# Patient Record
Sex: Male | Born: 1955 | Race: White | Hispanic: No | Marital: Married | Smoking: Never smoker
Health system: Southern US, Community
[De-identification: ages and names within clinical notes are randomized; demographics above are authoritative.]

## PROBLEM LIST (undated history)

## (undated) DIAGNOSIS — C3490 Malignant neoplasm of unspecified part of unspecified bronchus or lung: Secondary | ICD-10-CM

## (undated) DIAGNOSIS — E039 Hypothyroidism, unspecified: Secondary | ICD-10-CM

## (undated) DIAGNOSIS — D0439 Carcinoma in situ of skin of other parts of face: Secondary | ICD-10-CM

## (undated) DIAGNOSIS — I5020 Unspecified systolic (congestive) heart failure: Secondary | ICD-10-CM

## (undated) DIAGNOSIS — G4733 Obstructive sleep apnea (adult) (pediatric): Secondary | ICD-10-CM

## (undated) HISTORY — DX: Hypothyroidism, unspecified: E03.9

## (undated) HISTORY — DX: Obstructive sleep apnea (adult) (pediatric): G47.33

## (undated) HISTORY — DX: Carcinoma in situ of skin of other parts of face: D04.39

## (undated) HISTORY — DX: Malignant neoplasm of unspecified part of unspecified bronchus or lung: C34.90

## (undated) HISTORY — DX: Unspecified systolic (congestive) heart failure: I50.20

---

## 2003-07-02 ENCOUNTER — Ambulatory Visit (HOSPITAL_BASED_OUTPATIENT_CLINIC_OR_DEPARTMENT_OTHER): Admission: RE | Admit: 2003-07-02 | Discharge: 2003-07-02 | Payer: Self-pay | Admitting: Otolaryngology

## 2003-09-08 ENCOUNTER — Ambulatory Visit (HOSPITAL_COMMUNITY): Admission: RE | Admit: 2003-09-08 | Discharge: 2003-09-08 | Payer: Self-pay | Admitting: Otolaryngology

## 2017-12-17 ENCOUNTER — Encounter: Payer: Self-pay | Admitting: Cardiovascular Disease

## 2017-12-17 ENCOUNTER — Telehealth: Payer: Self-pay | Admitting: *Deleted

## 2017-12-17 ENCOUNTER — Ambulatory Visit: Payer: No Typology Code available for payment source | Admitting: Cardiovascular Disease

## 2017-12-17 VITALS — BP 134/82 | HR 111 | Ht 66.0 in | Wt 213.2 lb

## 2017-12-17 DIAGNOSIS — R600 Localized edema: Secondary | ICD-10-CM | POA: Diagnosis not present

## 2017-12-17 DIAGNOSIS — R9431 Abnormal electrocardiogram [ECG] [EKG]: Secondary | ICD-10-CM

## 2017-12-17 DIAGNOSIS — R0602 Shortness of breath: Secondary | ICD-10-CM

## 2017-12-17 DIAGNOSIS — R000 Tachycardia, unspecified: Secondary | ICD-10-CM | POA: Diagnosis not present

## 2017-12-17 DIAGNOSIS — R0600 Dyspnea, unspecified: Secondary | ICD-10-CM | POA: Diagnosis not present

## 2017-12-17 DIAGNOSIS — I452 Bifascicular block: Secondary | ICD-10-CM

## 2017-12-17 DIAGNOSIS — Z13220 Encounter for screening for lipoid disorders: Secondary | ICD-10-CM

## 2017-12-17 MED ORDER — CARVEDILOL 3.125 MG PO TABS: 3.1250 mg | ORAL_TABLET | Freq: Two times a day (BID) | ORAL | 3 refills | Status: DC

## 2017-12-17 MED ORDER — FUROSEMIDE 40 MG PO TABS: 40.0000 mg | ORAL_TABLET | Freq: Every day | ORAL | 3 refills | Status: DC

## 2017-12-17 NOTE — Patient Instructions (Addendum)
Medication Instructions:  START Lasix 40 mg daily START carvedilol (Coreg) 3.125 mg two times daily  If you need a refill on your cardiac medications before your next appointment, please call your pharmacy.   Lab work: Today (CMET, CBC, Lipid, TSH, Mag, BNP) If you have labs (blood work) drawn today and your tests are completely normal, you will receive your results only by: [name] MyChart Message (if you have MyChart) OR . A paper copy in the mail If you have any lab test that is abnormal or we need to change your treatment, we will call you to review the results.  Testing/Procedures: Your physician has requested that you have an echocardiogram THIS WEEK. Echocardiography is a painless test that uses sound waves to create images of your heart. It provides your doctor with information about the size and shape of your heart and how well your heart's chambers and valves are working. This procedure takes approximately one hour. There are no restrictions for this procedure.  Follow-Up: Monday 10/28 at 3:20 pm with Dr. Claiborne Billings   **patient called to make aware chest xray was added and to have this completed**

## 2017-12-17 NOTE — Telephone Encounter (Signed)
Spoke to patient. Patient walked int o the office stating he needs to see a cardiologist-  He has a EKG  WITH HIM -- C/O OF SHORTNESS OF BREATHE   BILATERAL LOWER  AREA FLUID RETENTION   OCCURRING FOR A FEW WEEKS. Patient has not establish with primary or  Cardiologist   RN  recommendation going to urgent care or ER. Patient wanted to know if anyone can see him today . RN informed patient  There is an availability today-  Appointment made. patient aware

## 2017-12-17 NOTE — Progress Notes (Signed)
Cardiology Office Note    Date:  12/17/2017   ID:  Melvin Marshall, Melvin Marshall 1955-11-05, MRN [id number]  PCP:  [facility]  Cardiologist:  Shelva Majestic, MD   Chief Complaint  Patient presents with  . Chest Pain    pressure   New cardiology evaluation.  He is seen as a walk-in for evaluation of progressive increasing shortness of breath leg swelling PND and orthopnea.  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  Mr. Hamada denies any awareness of prior cardiac disease.  In late August he began to notice some increasing shortness of breath and felt he was having some respiratory difficulties.  He went to urgent care, was given a Z-Pak and was started on empiric albuterol and received a Medrol Dosepak.  He did not have definitive benefit from this treatment.  He also admitted to postnasal drainage and has been taking some Claritin-D.  At the time he states his O2 saturation was 96% and his pulse was increased.  Over the past month he has noticed progressive shortness of breath particularly more with activity and with periods of agitation.  He also has noticed a vague chest pressure.  This past weekend while at work in the [employer] he was checked out apparently at the [employer].  He was not prescribed any therapy.  Because of increasing symptoms of shortness of breath, with development of PND orthopnea and at times feeling like he has to sit up at nighttime to breathe he walked into our office today and was worked into my schedule and seen as an add-on.  History is negative for prior surgeries. Current Medications: Outpatient Medications Prior to Visit  Medication Sig Dispense Refill  . albuterol (PROVENTIL HFA;VENTOLIN HFA) 108 (90 Base) MCG/ACT inhaler INHALE 1 TO 2 PUFFS EVERY 4 TO 6 HOURS AS NEEDED  0  . aspirin 81 MG chewable tablet  Chew 81 mg by mouth daily.    Melvin Marshall GARLIC PO Take by mouth.    . Omega-3 Fatty Acids (FISH OIL OMEGA-3 PO) Take by mouth daily.    . COD LIVER OIL PO Take by mouth daily.     No facility-administered medications prior to visit.      Allergies:   Patient has no known allergies.   Social History   Socioeconomic History  . Marital status: Married    Spouse name: Not on file  . Number of children: 4  . Years of education: Not on file  . Highest education level: Bachelor's degree (e.g., BA, AB, BS)  Occupational History  . Occupation: optometry    Comment: self employed  Social Needs  . Financial resource strain: Not on file  . Food insecurity:    Worry: Not on file    Inability: Not on file  . Transportation needs:    Medical: Not on file    Non-medical: Not on file  Tobacco Use  . Smoking status: Never Smoker  . Smokeless tobacco: Never Used  Substance and Sexual Activity  . Alcohol use: Not Currently  . Drug use: Never  . Sexual activity: Not on file  Lifestyle  . Physical activity:    Days per week: Not on file    Minutes per session: Not on file  . Stress: Not on file  Relationships  . Social connections:  Talks on phone: Not on file    Gets together: Not on file    Attends religious service: Not on file    Active member of club or organization: Not on file    Attends meetings of clubs or organizations: Not on file    Relationship status: Not on file  Other Topics Concern  . Not on file  Social History Narrative  . Not on file    Socially he was born in [place]. He did his undergraduate degree at [employer] and ultimately attended [employer] in [place].  He is married for 37 years.  There is no tobacco or alcohol use. He has 4 children.  Family History:  The patient's family history includes COPD in his father; Diabetes type I in his child; Heart failure in his father; Liver disease in his mother; Suicidality in his paternal grandfather.   His  mother died at age 35 and had liver disease from a transfusion, father died at 87 and had CHF and COPD.  He has 3 sisters ages 107, 67, and 59.  ROS General: Negative; No fevers, chills, or night sweats;  HEENT: Negative; No changes in vision or hearing, sinus congestion, difficulty swallowing Pulmonary: Negative; No cough, wheezing, shortness of breath, hemoptysis Cardiovascular: See HPI GI: Negative; No nausea, vomiting, diarrhea, or abdominal pain GU: Negative; No dysuria, hematuria, or difficulty voiding Musculoskeletal: Negative; no myalgias, joint pain, or weakness Hematologic/Oncology: Negative; no easy bruising, bleeding Endocrine: Negative; no heat/cold intolerance; no diabetes Neuro: Negative; no changes in balance, headaches Skin: Negative; No rashes or skin lesions Psychiatric: Negative; No behavioral problems, depression Sleep: Negative; No snoring, daytime sleepiness, hypersomnolence, bruxism, restless legs, hypnogognic hallucinations, no cataplexy Other comprehensive 14 point system review is negative.   PHYSICAL EXAM:   VS:  BP 134/82   Pulse (!) 111   Ht 5' 6"  (1.676 m)   Wt 213 lb 3.2 oz (96.7 kg)   BMI 34.41 kg/m     Repeat blood pressure by me 130/78  Wt Readings from Last 3 Encounters:  12/17/17 213 lb 3.2 oz (96.7 kg)    General: Alert, oriented, no distress.  Skin: normal turgor, no rashes, warm and dry HEENT: Normocephalic, atraumatic. Pupils equal round and reactive to light; sclera anicteric; extraocular muscles intact; Fundi no hemorrhages or exudates. Nose without nasal septal hypertrophy Mouth/Parynx benign; Mallinpatti scale 3 Neck: No JVD, no carotid bruits; normal carotid upstroke Lungs: Decreased breath sounds at the bases without audible wheezing Chest wall: without tenderness to palpitation Heart: PMI not displaced, tachycardic at 111, s1 s2 normal, 1/6 systolic murmur, no diastolic murmur, summation gallop, no thrills, or heaves Abdomen:  soft, nontender; no hepatosplenomehaly, BS+; abdominal aorta nontender and not dilated by palpation. Back: no CVA tenderness Pulses 2+ Musculoskeletal: full range of motion, normal strength, no joint deformities Extremities: 2-3+ pitting edema to below the knees no clubbing cyanosis, Homan's sign negative  Neurologic: grossly nonfocal; Cranial nerves grossly wnl Psychologic: Normal mood and affect   Studies/Labs Reviewed:   EKG:  EKG is ordered today.  ECG (independently read by me): Sinus tachycardia at 111 bpm.  PVC.  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  PR interval 158 ms.  QTc interval 505 ms.    I personally reviewed the ECG that he had done at the [facility] on December 15, 2017 which shows sinus rhythm at 98 with right bundle branch block and left anterior hemiblock; PVC.  Recent Labs: No flowsheet data found.   No flowsheet data found.  No flowsheet data found. No results found for: MCV No results found for: TSH No results found for: HGBA1C   BNP No results found for: BNP  ProBNP No results found for: PROBNP   Lipid Panel  No results found for: CHOL, TRIG, HDL, CHOLHDL, VLDL, LDLCALC, LDLDIRECT   RADIOLOGY: No results found.   Additional studies/ records that were reviewed today include:  No prior medical records available   ASSESSMENT:    1. Shortness of breath   2. Paroxysmal nocturnal dyspnea   3. Bilateral leg edema   4. Tachycardia   5. Right bundle branch block with left anterior fascicular block   6. Abnormal EKG   7. 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  He had developed some respiratory symptoms in late August and empirically was treated with a Z-Pak, albuterol, and Medrol Dosepak.  Over the past month he has had progressive symptoms of shortness of breath.  Upon further questioning it appears he is having some PND  and orthopnea.  He is unaware of recent audible wheezing.  He has not had a recent chest x-ray.  His examination is notable for at least 2-3+ pitting edema to below the knees.  He has not had any recent laboratory.  I do not know his renal function or laboratory status.  I am concerned that he is having symptoms highly suggestive of heart failure.  He is tachycardic and has a summation gallop.  He also has evidence for conduction abnormality with right bundle branch block and left anterior hemiblock.  His ECG at the [facility] did show some occasional PVCs.  I am giving him a prescription for furosemide 40 mg to initiate daily.  I will start him on carvedilol 3.125 mg twice a day.  I am recommending he undergo a PA and lateral chest x-ray.  I am checking laboratory today consisting of a C met, magnesium level, BNP, TSH, and lipid studies.  I will schedule him for an echo Doppler study to evaluate systolic and diastolic function.  I will work him into my office next week and see him in 1 week for reevaluation.  Medication Adjustments/Labs and Tests Ordered: Current medicines are reviewed at length with the patient today.  Concerns regarding medicines are outlined above.  Medication changes, Labs and Tests ordered today are listed in the Patient Instructions below. Patient Instructions  Medication Instructions:  START Lasix 40 mg daily START carvedilol (Coreg) 3.125 mg two times daily  If you need a refill on your cardiac medications before your next appointment, please call your pharmacy.   Lab work: Today (CMET, CBC, Lipid, TSH, Mag, BNP) If you have labs (blood work) drawn today and your tests are completely normal, you will receive your results only by: Melvin Marshall MyChart Message (if you have MyChart) OR . A paper copy in the mail If you have any lab test that is abnormal or we need to change your treatment, we will call you to review the results.  Testing/Procedures: Your physician has requested  that you have an echocardiogram THIS WEEK. Echocardiography is a painless test that uses sound waves to create images of your heart. It provides your doctor with information about the size and shape of your heart and how well your heart's chambers and valves are working. This procedure takes approximately one hour.  There are no restrictions for this procedure.  Follow-Up: Monday 10/28 at 3:20 pm with Dr. Claiborne Billings   **patient called to make aware chest xray was added and to have this completed**    Signed, Shelva Majestic, MD  12/17/2017 7:22 PM    [facility] [address] [place], [place], [place]  [address] Phone: [phone]

## 2017-12-18 LAB — COMPREHENSIVE METABOLIC PANEL
A/G RATIO: 1.7 (ref 1.2–2.2)
ALT: 22 IU/L (ref 0–44)
AST: 22 IU/L (ref 0–40)
Albumin: 4.1 g/dL (ref 3.6–4.8)
Alkaline Phosphatase: 53 IU/L (ref 39–117)
BUN/Creatinine Ratio: 28 — ABNORMAL HIGH (ref 10–24)
BUN: 30 mg/dL — ABNORMAL HIGH (ref 8–27)
Bilirubin Total: 0.7 mg/dL (ref 0.0–1.2)
CO2: 21 mmol/L (ref 20–29)
CREATININE: 1.07 mg/dL (ref 0.76–1.27)
Calcium: 9 mg/dL (ref 8.6–10.2)
Chloride: 104 mmol/L (ref 96–106)
GFR, EST AFRICAN AMERICAN: 86 mL/min/{1.73_m2} (ref >59)
GFR, EST NON AFRICAN AMERICAN: 75 mL/min/{1.73_m2} (ref >59)
GLOBULIN, TOTAL: 2.4 g/dL (ref 1.5–4.5)
GLUCOSE: 110 mg/dL — AB (ref 65–99)
POTASSIUM: 4.4 mmol/L (ref 3.5–5.2)
SODIUM: 142 mmol/L (ref 134–144)
TOTAL PROTEIN: 6.5 g/dL (ref 6.0–8.5)

## 2017-12-18 LAB — BRAIN NATRIURETIC PEPTIDE: BNP: 1653.5 pg/mL — ABNORMAL HIGH (ref 0.0–100.0)

## 2017-12-18 LAB — CBC
HEMOGLOBIN: 14.6 g/dL (ref 13.0–17.7)
Hematocrit: 45.9 % (ref 37.5–51.0)
MCH: 26.3 pg — AB (ref 26.6–33.0)
MCHC: 31.8 g/dL (ref 31.5–35.7)
MCV: 83 fL (ref 79–97)
PLATELETS: 172 10*3/uL (ref 150–450)
RBC: 5.55 x10E6/uL (ref 4.14–5.80)
RDW: 16.6 % — ABNORMAL HIGH (ref 12.3–15.4)
WBC: 4.3 10*3/uL (ref 3.4–10.8)

## 2017-12-18 LAB — LIPID PANEL
CHOLESTEROL TOTAL: 169 mg/dL (ref 100–199)
Chol/HDL Ratio: 5.5 ratio — ABNORMAL HIGH (ref 0.0–5.0)
HDL: 31 mg/dL — ABNORMAL LOW (ref >39)
LDL CALC: 116 mg/dL — AB (ref 0–99)
Triglycerides: 109 mg/dL (ref 0–149)
VLDL CHOLESTEROL CAL: 22 mg/dL (ref 5–40)

## 2017-12-18 LAB — MAGNESIUM: Magnesium: 2.1 mg/dL (ref 1.6–2.3)

## 2017-12-18 LAB — TSH: TSH: 4.7 u[IU]/mL — ABNORMAL HIGH (ref 0.450–4.500)

## 2017-12-20 ENCOUNTER — Ambulatory Visit
Admission: RE | Admit: 2017-12-20 | Discharge: 2017-12-20 | Disposition: A | Discharge status: Home / Self Care | Payer: No Typology Code available for payment source | Source: Ambulatory Visit | Attending: Cardiovascular Disease | Admitting: Cardiovascular Disease

## 2017-12-20 ENCOUNTER — Other Ambulatory Visit (HOSPITAL_COMMUNITY): Payer: No Typology Code available for payment source

## 2017-12-20 DIAGNOSIS — R0602 Shortness of breath: Secondary | ICD-10-CM

## 2017-12-20 DIAGNOSIS — R000 Tachycardia, unspecified: Secondary | ICD-10-CM

## 2017-12-21 ENCOUNTER — Other Ambulatory Visit: Payer: Self-pay

## 2017-12-21 ENCOUNTER — Ambulatory Visit (HOSPITAL_COMMUNITY): Payer: No Typology Code available for payment source | Attending: Cardiovascular Disease

## 2017-12-21 DIAGNOSIS — R9431 Abnormal electrocardiogram [ECG] [EKG]: Secondary | ICD-10-CM | POA: Diagnosis present

## 2017-12-21 DIAGNOSIS — R600 Localized edema: Secondary | ICD-10-CM | POA: Diagnosis not present

## 2017-12-21 DIAGNOSIS — R0602 Shortness of breath: Secondary | ICD-10-CM | POA: Insufficient documentation

## 2017-12-21 MED ORDER — PERFLUTREN LIPID MICROSPHERE
1.0000 mL | INTRAVENOUS | Status: AC | PRN
  Administered 2017-12-21: 2 mL via INTRAVENOUS

## 2017-12-24 ENCOUNTER — Other Ambulatory Visit (HOSPITAL_COMMUNITY): Payer: No Typology Code available for payment source

## 2017-12-24 ENCOUNTER — Ambulatory Visit: Payer: No Typology Code available for payment source | Admitting: Cardiovascular Disease

## 2017-12-24 ENCOUNTER — Encounter: Payer: Self-pay | Admitting: Cardiovascular Disease

## 2017-12-24 ENCOUNTER — Ambulatory Visit (INDEPENDENT_AMBULATORY_CARE_PROVIDER_SITE_OTHER): Payer: No Typology Code available for payment source | Admitting: Cardiovascular Disease

## 2017-12-24 VITALS — BP 104/80 | HR 94 | Ht 66.0 in | Wt 197.4 lb

## 2017-12-24 DIAGNOSIS — I452 Bifascicular block: Secondary | ICD-10-CM

## 2017-12-24 DIAGNOSIS — R600 Localized edema: Secondary | ICD-10-CM | POA: Diagnosis not present

## 2017-12-24 DIAGNOSIS — R0602 Shortness of breath: Secondary | ICD-10-CM

## 2017-12-24 DIAGNOSIS — I5041 Acute combined systolic (congestive) and diastolic (congestive) heart failure: Secondary | ICD-10-CM | POA: Diagnosis not present

## 2017-12-24 MED ORDER — SACUBITRIL-VALSARTAN 24-26 MG PO TABS: 1.0000 | ORAL_TABLET | Freq: Two times a day (BID) | ORAL | 0 refills | Status: DC

## 2017-12-24 NOTE — Patient Instructions (Addendum)
Medication Instructions:  Start Entresto 24/26 mg (1 tablet) two times daily  If you need a refill on your cardiac medications before your next appointment, please call your pharmacy.   Follow-Up: Thursday 11/7 at 11:30 am with pharmacist  Monday 11/25 at 4 pm with Dr. Claiborne Billings

## 2017-12-24 NOTE — Progress Notes (Signed)
Cardiology Office Note    Date:  12/24/2017   ID:  Melvin Marshall, Melvin Marshall 15-Dec-1955, MRN [id number]  PCP:  [facility]  Cardiologist:  Shelva Majestic, MD   No chief complaint on file.  F/U cardiology evaluation.  I saw him as a walk-in for evaluation of progressive increasing shortness of breath leg swelling PND and orthopnea on 12/17/2017.  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  Melvin Marshall denies any awareness of prior cardiac disease.  In late August he began to notice some increasing shortness of breath and felt he was having some respiratory difficulties.  He went to urgent care, was given a Z-Pak and was started on empiric albuterol and received a Medrol Dosepak.  He did not have definitive benefit from this treatment.  He also admitted to postnasal drainage and has been taking some Claritin-D.  At the time he states his O2 saturation was 96% and his pulse was increased.  Over the past month he has noticed progressive shortness of breath particularly more with activity and with periods of agitation.  He also has noticed a vague chest pressure.  This past weekend while at work in the [employer] he was checked out apparently at the [employer].  He was not prescribed any therapy.  Because of increasing symptoms of shortness of breath, with development of PND orthopnea and at times feeling like he has to sit up at nighttime to breathe he walked into our on December 17, 2017 office  and was worked into my schedule and seen as an add-on.  When I saw him, I was concerned that his symptom complex were highly suggestive of acute systolic heart failure.  At the time, I did not know any laboratory status and in particular was unaware of his renal function.  He was tachycardic with a pulse of 111 and had a summation gallop in addition to 2-3+  pitting edema to below the knees.  I initiated carvedilol at 3.125 mg twice a day and started furosemide 40 mg daily.  He underwent an chest x-ray which showed right basilar infiltrate with associated effusion.  Laboratory revealed an elevated BNP at 1653.  Creatinine was 1.07.  Hemoglobin 14.6, hematocrit 45.9.  Lipid studies revealed total cholesterol 169, HDL 31, LDL 116 and triglycerides 109.  TSH was 4.7.  I recommended he return to the office 1 week later for reassessment.  Over the past week, he states that he has lost 16 pounds of fluid.  His lower extremity edema is significantly better.  He is breathing better.  He denies chest pain or palpitations.  He presents for reevaluation.  History is negative for prior surgeries.  Current Medications: Outpatient Medications Prior to Visit  Medication Sig Dispense Refill  . albuterol (PROVENTIL HFA;VENTOLIN HFA) 108 (90 Base) MCG/ACT inhaler INHALE 1 TO 2 PUFFS EVERY 4 TO 6 HOURS AS NEEDED  0  . aspirin 81 MG chewable tablet Chew 81 mg by mouth daily.    . carvedilol (COREG) 3.125 MG tablet Take 1 tablet (3.125 mg total) by mouth 2 (two) times daily. 60 tablet 3  . COD LIVER OIL PO Take by mouth daily.    . furosemide (LASIX) 40 MG tablet Take 1 tablet (40 mg total) by mouth daily. 90 tablet 3  . GARLIC PO Take by mouth.    [name]  Loratadine-Pseudoephedrine (CLARITIN-D 24 HOUR PO) Take by mouth.    . Omega-3 Fatty Acids (FISH OIL OMEGA-3 PO) Take by mouth daily.     No facility-administered medications prior to visit.      Allergies:   Patient has no known allergies.   Social History   Socioeconomic History  . Marital status: Married    Spouse name: Not on file  . Number of children: 4  . Years of education: Not on file  . Highest education level: Bachelor's degree (e.g., BA, AB, BS)  Occupational History  . Occupation: optometry    Comment: self employed  Social Needs  . Financial resource strain: Not on file  . Food insecurity:     Worry: Not on file    Inability: Not on file  . Transportation needs:    Medical: Not on file    Non-medical: Not on file  Tobacco Use  . Smoking status: Never Smoker  . Smokeless tobacco: Never Used  Substance and Sexual Activity  . Alcohol use: Not Currently  . Drug use: Never  . Sexual activity: Not on file  Lifestyle  . Physical activity:    Days per week: Not on file    Minutes per session: Not on file  . Stress: Not on file  Relationships  . Social connections:    Talks on phone: Not on file    Gets together: Not on file    Attends religious service: Not on file    Active member of club or organization: Not on file    Attends meetings of clubs or organizations: Not on file    Relationship status: Not on file  Other Topics Concern  . Not on file  Social History Narrative  . Not on file    Socially he was born in [place]. He did his undergraduate degree at [employer] and ultimately attended [employer] in [place].  He is married for 37 years.  There is no tobacco or alcohol use. He has 4 children.  Family History:  The patient's family history includes COPD in his father; Diabetes type I in his child; Heart failure in his father; Liver disease in his mother; Suicidality in his paternal grandfather.   His mother died at age 46 and had liver disease from a transfusion, father died at 18 and had CHF and COPD.  He has 3 sisters ages 42, 41, and 32.  ROS General: Negative; No fevers, chills, or night sweats;  HEENT: Negative; No changes in vision or hearing, sinus congestion, difficulty swallowing Pulmonary: Negative; No cough, wheezing, shortness of breath, hemoptysis Cardiovascular: See HPI GI: Negative; No nausea, vomiting, diarrhea, or abdominal pain GU: Negative; No dysuria, hematuria, or difficulty voiding Musculoskeletal: Negative; no myalgias, joint pain, or weakness Hematologic/Oncology: Negative; no easy bruising, bleeding Endocrine: Negative; no  heat/cold intolerance; no diabetes Neuro: Negative; no changes in balance, headaches Skin: Negative; No rashes or skin lesions Psychiatric: Negative; No behavioral problems, depression Sleep: Negative; No snoring, daytime sleepiness, hypersomnolence, bruxism, restless legs, hypnogognic hallucinations, no cataplexy Other comprehensive 14 point system review is negative.   PHYSICAL EXAM:   VS:  BP 104/80   Pulse 94   Ht _0  (1.676 m)   Wt 197 lb 6.4 oz (89.5 kg)   BMI 31.86 kg/m     Repeat blood pressure by me was 120/80 supine and 124/80 standing  Wt Readings from Last 3 Encounters:  12/24/17 197 lb 6.4 oz (89.5 kg)  12/17/17 213  lb 3.2 oz (96.7 kg)      Physical Exam BP 104/80   Pulse 94   Ht '5\' 6"'$  (1.676 m)   Wt 197 lb 6.4 oz (89.5 kg)   BMI 31.86 kg/m  General: Alert, oriented, no distress.  Skin: normal turgor, no rashes, warm and dry HEENT: Normocephalic, atraumatic. Pupils equal round and reactive to light; sclera anicteric; extraocular muscles intact;  Nose without nasal septal hypertrophy Mouth/Parynx benign; Mallinpatti scale 3 Neck: No JVD, no carotid bruits; normal carotid upstroke Lungs: clear to ausculatation and percussion; no wheezing or rales Chest wall: without tenderness to palpitation Heart: PMI not displaced, RRR, s1 s2 normal, 1/6 systolic murmur, summation gallop no longer heard; no diastolic murmur, no rubs, gallops, thrills, or heaves Abdomen: soft, nontender; no hepatosplenomehaly, BS+; abdominal aorta nontender and not dilated by palpation. Back: no CVA tenderness Pulses 2+ Musculoskeletal: full range of motion, normal strength, no joint deformities Extremities:  1+ edema, significantly improved from 3+ pitting;no clubbing cyanosis or edema, Homan's sign negative  Neurologic: grossly nonfocal; Cranial nerves grossly wnl Psychologic: Normal mood and affect   Studies/Labs Reviewed:   EKG:  EKG is ordered today.  ECG (independently read by  me): NSR at 94; RBBB with repolarization changes; QTc 512 msec  December 17, 2017 ECG (independently read by me): Sinus tachycardia at 111 bpm.  PVC.  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  PR interval 158 ms.  QTc interval 505 ms.    I personally reviewed the ECG that he had done at the [facility] on December 15, 2017 which shows sinus rhythm at 98 with right bundle branch block and left anterior hemiblock; PVC.   Recent Labs: BMP Latest Ref Rng & Units 12/17/2017  Glucose 65 - 99 mg/dL 110(H)  BUN 8 - 27 mg/dL 30(H)  Creatinine 0.76 - 1.27 mg/dL 1.07  BUN/Creat Ratio 10 - 24 28(H)  Sodium 134 - 144 mmol/L 142  Potassium 3.5 - 5.2 mmol/L 4.4  Chloride 96 - 106 mmol/L 104  CO2 20 - 29 mmol/L 21  Calcium 8.6 - 10.2 mg/dL 9.0     Hepatic Function Latest Ref Rng & Units 12/17/2017  Total Protein 6.0 - 8.5 g/dL 6.5  Albumin 3.6 - 4.8 g/dL 4.1  AST 0 - 40 IU/L 22  ALT 0 - 44 IU/L 22  Alk Phosphatase 39 - 117 IU/L 53  Total Bilirubin 0.0 - 1.2 mg/dL 0.7    CBC Latest Ref Rng & Units 12/17/2017  WBC 3.4 - 10.8 x10E3/uL 4.3  Hemoglobin 13.0 - 17.7 g/dL 14.6  Hematocrit 37.5 - 51.0 % 45.9  Platelets 150 - 450 x10E3/uL 172   Lab Results  Component Value Date   MCV 83 12/17/2017   Lab Results  Component Value Date   TSH 4.700 (H) 12/17/2017   No results found for: HGBA1C   BNP    Component Value Date/Time   BNP 1,653.5 (H) 12/17/2017 1026    ProBNP No results found for: PROBNP   Lipid Panel     Component Value Date/Time   CHOL 169 12/17/2017 1026   TRIG 109 12/17/2017 1026   HDL 31 (L) 12/17/2017 1026   CHOLHDL 5.5 (H) 12/17/2017 1026   LDLCALC 116 (H) 12/17/2017 1026     RADIOLOGY: Dg Chest 2 View  Result Date: 12/20/2017 CLINICAL DATA:  Shortness of breath for 2 weeks EXAM: CHEST - 2 VIEW COMPARISON:  None. FINDINGS: Cardiac shadow is enlarged. Right  basilar infiltrate is noted with associated small effusion.  Left lung is clear. No acute bony abnormality is noted. IMPRESSION: Right basilar infiltrate with associated effusion. Followup PA and lateral chest X-ray is recommended in 3-4 weeks following trial of antibiotic therapy to ensure resolution and exclude underlying malignancy. Electronically Signed   By: Inez Catalina M.D.   On: 12/20/2017 12:52     Additional studies/ records that were reviewed today include:  Reviewed his laboratory, chest x-ray, and echo Doppler study.  ------------------------------------------------------------------- 12/21/2017 ECHO Study Conclusions  - Left ventricle: The cavity size was severely dilated. Wall   thickness was increased in a pattern of mild LVH. Systolic   function was severely reduced. The estimated ejection fraction   was in the range of 20% to 25%. Irregular apical contour, no   definite thrombus with Definity contrast. Severe global   hypokinesis with regional variation. The study is not technically   sufficient to allow evaluation of LV diastolic function. - Aortic valve: Trileaflet; mildly thickened leaflets. There was no   regurgitation. - Mitral valve: Mildly thickened leaflets . There was moderate   regurgitation. - Left atrium: Severely dilated. - Right ventricle: The cavity size was mildly dilated. Mild   systolic dysfunction. - Right atrium: The atrium was mildly dilated. - Tricuspid valve: There was moderate regurgitation. - Pulmonary arteries: PA peak pressure: 56 mm Hg (S). - Inferior vena cava: The vessel was dilated. The respirophasic   diameter changes were blunted (< 50%), consistent with elevated   central venous pressure. - Pericardium, extracardiac: Small pericardial effusion (mostly   posterior).  Impressions:  - LVEF 20-25%, severely dilated LV with mild LVH, severe global   hypokinesis with regional variation, moderate MR, severe LAE,   mild RVE with mild RV systolic dysfunction, mild RAE, moderate   TR, RVSP 56  mmHg, dilated IVC, small mostly posterior pericardial   effusion.  ASSESSMENT:    No diagnosis found.   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  He had developed  respiratory symptoms in late August and empirically was treated with a Z-Pak, albuterol, and Medrol Dosepak.  Over the past month he has had progressive symptoms of shortness of breath.  When I saw him, he was having PND, orthopnea, had 2-3+ pitting edema, and a summation gallop on exam.  Subsequent evaluation has confirmed my suspicion for CHF.  His echo Doppler study reveals an EF of 20 to 25% with significant LV dilation at end diastole at 68 mm in end systole at 58 mm.  There was moderate MR.  He had severe left atrial dilatation, mild right atrial dilatation, moderate TR, and moderate pulmonary hypertension with a peak PA pressure of 56.  He has lost 16 pounds over the past week which most likely is predominant fluid.  I reviewed his data with both he and his wife today.  I am initiating Entresto 24/26 mg twice daily for initial therapy.  I also again have recommended support stockings.  I have arranged for him to see our pharmacist next week and if he is doing well on the Entresto 24/26 mg dose I have recommended that Spironolactone be initiated at that time.  Repeat laboratory will be obtained.  We will also further attempt to increase carvedilol as blood pressure and pulse allow.  If tachycardia continues he may be a candidate for Corlanor.  I will see him in 4 weeks for reevaluation.  Over the next several months for we will try to optimize dose titration to maximum dosing and he ultimately will require an ischemic evaluation make certain his LV dysfunction is not ischemia mediated.    Medication Adjustments/Labs and Tests Ordered: Current medicines are reviewed at length with the patient today.  Concerns regarding medicines are outlined above.  Medication changes, Labs  and Tests ordered today are listed in the Patient Instructions below. There are no Patient Instructions on file for this visit.   Signed, Shelva Majestic, MD  12/24/2017 4:20 PM    [facility] [address] [place], [place], [place]  [address] Phone: [phone]

## 2017-12-26 ENCOUNTER — Encounter: Payer: Self-pay | Admitting: Cardiovascular Disease

## 2017-12-28 ENCOUNTER — Ambulatory Visit: Payer: No Typology Code available for payment source | Admitting: Cardiovascular Disease

## 2018-01-01 ENCOUNTER — Ambulatory Visit: Payer: No Typology Code available for payment source

## 2018-01-03 ENCOUNTER — Encounter: Payer: Self-pay | Admitting: Pharmacist Clinician (PhC)/ Clinical Pharmacy Specialist

## 2018-01-03 ENCOUNTER — Ambulatory Visit (INDEPENDENT_AMBULATORY_CARE_PROVIDER_SITE_OTHER)
Payer: No Typology Code available for payment source | Admitting: Pharmacist Clinician (PhC)/ Clinical Pharmacy Specialist

## 2018-01-03 DIAGNOSIS — I5041 Acute combined systolic (congestive) and diastolic (congestive) heart failure: Secondary | ICD-10-CM | POA: Insufficient documentation

## 2018-01-03 NOTE — Assessment & Plan Note (Signed)
Patient with heart failure, doing much better today.  His weight has dropped another 11 pounds in the past 10 days, so I am going to have him cut the furosemide from 40 mg to 20 mg daily.   His blood pressure was low (but not symptomatic) at 96/62.  Because of this will not be able to start spironolactone or increase either the Entresto or carvedilol.  He is due to see Dr. Claiborne Billings in 3 weeks for follow up.

## 2018-01-03 NOTE — Patient Instructions (Addendum)
Return for a a follow up appointment with Dr. Claiborne Billings in 2-3 weeks  Your blood pressure today is 96/62  Check your blood pressure at home daily (if able) and keep record of the readings.  Take your BP meds as follows:  Continue with all current medications  Cut lasx to 20  Bring all of your meds, your BP cuff and your record of home blood pressures to your next appointment.  Exercise as you're able, try to walk approximately 30 minutes per day.  Keep salt intake to a minimum, especially watch canned and prepared boxed foods.  Eat more fresh fruits and vegetables and fewer canned items.  Avoid eating in fast food restaurants.    HOW TO TAKE YOUR BLOOD PRESSURE: . Rest 5 minutes before taking your blood pressure. .  Don't smoke or drink caffeinated beverages for at least 30 minutes before. . Take your blood pressure before (not after) you eat. . Sit comfortably with your back supported and both feet on the floor (don't cross your legs). . Elevate your arm to heart level on a table or a desk. . Use the proper sized cuff. It should fit smoothly and snugly around your bare upper arm. There should be enough room to slip a fingertip under the cuff. The bottom edge of the cuff should be 1 inch above the crease of the elbow. . Ideally, take 3 measurements at one sitting and record the average.

## 2018-01-03 NOTE — Progress Notes (Signed)
01/03/2018 Melvin Marshall [birth year] [id number]   HPI:  Melvin Marshall is a 62 y.o. male patient of Dr Claiborne Billings, with a PMH below who presents today for heart failure medication titration.  He was seen initially in our office on Oct 21 with complaints of shortness of breath.  He had not seen cardiology prior to that time.  In August he began noticing the SOB.  Went to UC and was given zpak, medrol dosepak and albuterol MDI.  He did not have any significant benefit and continued on until mid-October.  He spent a weekend working in the [employer] and again had increased symptoms, along with PND.  Dr. Claiborne Billings started him on furosemide 40 mg daily and carvedilol 3.1215 mg bid, did full blood work and ordered an echocardiogram.   He saw Dr. Claiborne Billings one week later and was found to be down 16 pounds of fluid, with better breathing and less edema.  The Echo showed EF of 20-25% with a severely dilated LV.   Entresto 24/26 mg was started and he is here today with CVRR for follow up.  Dr. Claiborne Billings is hopeful that we can initiate spironolactone today.   Today patient reports he is feeling well.  No chest pain or shortness of breath, and no lower extremity edema.  He does have some concern about fatigue/apathy since starting the beta blocker.    Blood Pressure Goal:  130/80  Current Medications:  Furosemide 40 mg  Carvedilol 3.125 mg bid  Entresto 24/26 mg bid  Family Hx:  Father had CABG in late 23's, died at 35  Mother died at 3 from non-alcoholic liver disease  3 sisters, no specific cardiac issues  1 daughter with DM1  Social Hx:  No regular alcohol, no tobacco; drinking decaf recently, rarely more than 1-2 per day  Diet:  Has monitored sodium very closely since dx. Previously admits he probably ate much more than RDA, but now he figures about 1000-1300 mg per day.     Exercise:  Not much in past year.  Previously walked driveway 0.2 mi each way, would like to get back to it, but unsure because  of heart problems.  Home BP readings: none  Intolerances:  nkda  Labs: 11/2017:  Na 142, K 4.4, Glu 110, BUN 30, SCr 1.07, GFR 75; BNP 1653.5      Wt Readings from Last 3 Encounters:  01/03/18 186 lb 6.4 oz (84.6 kg)  12/24/17 197 lb 6.4 oz (89.5 kg)  12/17/17 213 lb 3.2 oz (96.7 kg)   BP Readings from Last 3 Encounters:  01/03/18 96/62  12/24/17 104/80  12/17/17 134/82   Pulse Readings from Last 3 Encounters:  01/03/18 80  12/24/17 94  12/17/17 (!) 111    Current Outpatient Medications  Medication Sig Dispense Refill  . aspirin 81 MG chewable tablet Chew 81 mg by mouth daily.    . carvedilol (COREG) 3.125 MG tablet Take 1 tablet (3.125 mg total) by mouth 2 (two) times daily. 60 tablet 3  . COD LIVER OIL PO Take by mouth daily.    . furosemide (LASIX) 40 MG tablet Take 1 tablet (40 mg total) by mouth daily. 90 tablet 3  . GARLIC PO Take by mouth.    . Omega-3 Fatty Acids (FISH OIL OMEGA-3 PO) Take by mouth daily.    . sacubitril-valsartan (ENTRESTO) 24-26 MG Take 1 tablet by mouth 2 (two) times daily. 28 tablet 0   No current facility-administered  medications for this visit.     No Known Allergies  History reviewed. No pertinent past medical history.  Blood pressure 96/62, pulse 80, weight 186 lb 6.4 oz (84.6 kg).  Acute combined systolic and diastolic heart failure [facility]) Patient with heart failure, doing much better today.  His weight has dropped another 11 pounds in the past 10 days, so I am going to have him cut the furosemide from 40 mg to 20 mg daily.   His blood pressure was low (but not symptomatic) at 96/62.  Because of this will not be able to start spironolactone or increase either the Entresto or carvedilol.  He is due to see Dr. Claiborne Billings in 3 weeks for follow up.     Tommy Medal PharmD CPP [facility] [address] [place], [place] [address] [phone]

## 2018-01-08 ENCOUNTER — Telehealth: Payer: Self-pay | Admitting: Cardiovascular Disease

## 2018-01-08 ENCOUNTER — Other Ambulatory Visit: Payer: Self-pay | Admitting: Cardiovascular Disease

## 2018-01-08 ENCOUNTER — Other Ambulatory Visit: Payer: Self-pay | Admitting: *Deleted

## 2018-01-08 MED ORDER — SACUBITRIL-VALSARTAN 24-26 MG PO TABS: 1.0000 | ORAL_TABLET | Freq: Two times a day (BID) | ORAL | 1 refills | Status: DC

## 2018-01-08 NOTE — Telephone Encounter (Signed)
Informed pt samples are available for pick up at the front desk.  Entresto 24/26mg  Qty; 1 bottle Lot# [id number]

## 2018-01-08 NOTE — Telephone Encounter (Signed)
New Message   Patient calling the office for samples of medication:   1.  What medication and dosage are you requesting samples for? sacubitril-valsartan (ENTRESTO) 24-26 MG  2.  Are you currently out of this medication? yes

## 2018-01-08 NOTE — Telephone Encounter (Signed)
New Message   Pt states he is still waiting on this prescription   *STAT* If patient is at the pharmacy, call can be transferred to refill team.   1. Which medications need to be refilled? (please list name of each medication and dose if known) sacubitril-valsartan (ENTRESTO) 24-26 MG  2. Which pharmacy/location (including street and city if local pharmacy) is medication to be sent to? [id number] - [place], [place] - [address]  3. Do they need a 30 day or 90 day supply? [place]

## 2018-01-10 NOTE — Telephone Encounter (Signed)
Patient called again for his refill.

## 2018-01-10 NOTE — Telephone Encounter (Signed)
Rx sent 01-08-18 to [id number] - [place], [place] - [address]

## 2018-01-14 ENCOUNTER — Other Ambulatory Visit: Payer: Self-pay

## 2018-01-14 MED ORDER — SACUBITRIL-VALSARTAN 24-26 MG PO TABS: 1.0000 | ORAL_TABLET | Freq: Two times a day (BID) | ORAL | 5 refills | Status: DC

## 2018-01-14 NOTE — Telephone Encounter (Signed)
Rx(s) sent to pharmacy electronically.  

## 2018-01-21 ENCOUNTER — Ambulatory Visit (INDEPENDENT_AMBULATORY_CARE_PROVIDER_SITE_OTHER): Payer: No Typology Code available for payment source | Admitting: Cardiovascular Disease

## 2018-01-21 ENCOUNTER — Encounter: Payer: Self-pay | Admitting: Cardiovascular Disease

## 2018-01-21 VITALS — BP 118/70 | HR 85 | Ht 66.0 in | Wt 183.8 lb

## 2018-01-21 DIAGNOSIS — I452 Bifascicular block: Secondary | ICD-10-CM

## 2018-01-21 DIAGNOSIS — R600 Localized edema: Secondary | ICD-10-CM

## 2018-01-21 DIAGNOSIS — R0600 Dyspnea, unspecified: Secondary | ICD-10-CM | POA: Diagnosis not present

## 2018-01-21 DIAGNOSIS — I5041 Acute combined systolic (congestive) and diastolic (congestive) heart failure: Secondary | ICD-10-CM | POA: Diagnosis not present

## 2018-01-21 MED ORDER — SACUBITRIL-VALSARTAN 49-51 MG PO TABS: 1.0000 | ORAL_TABLET | Freq: Two times a day (BID) | ORAL | 3 refills | Status: DC

## 2018-01-21 MED ORDER — FUROSEMIDE 20 MG PO TABS: 20.0000 mg | ORAL_TABLET | Freq: Every day | ORAL | 3 refills | Status: DC

## 2018-01-21 NOTE — Patient Instructions (Signed)
Medication Instructions:  INCREASE Entresto to 49/51 mg two times daily DECREASE furosemide (Lasix) to 20 mg daily  Testing/Procedures: Your physician has recommended that you have a sleep study. This test records several body functions during sleep, including: brain activity, eye movement, oxygen and carbon dioxide blood levels, heart rate and rhythm, breathing rate and rhythm, the flow of air through your mouth and nose, snoring, body muscle movements, and chest and belly movement.  Follow-Up: At [facility], you and your health needs are our priority.  As part of our continuing mission to provide you with exceptional heart care, we have created designated Provider Care Teams.  These Care Teams include your primary Cardiologist (physician) and Advanced Practice Providers (APPs -  Physician Assistants and Nurse Practitioners) who all work together to provide you with the care you need, when you need it. You will need a follow up appointment in 6-8 weeks.  Please call our office 2 months in advance to schedule this appointment.  You may see Dr. Claiborne Billings or one of the following Advanced Practice Providers on your designated Care Team: [place], [place] . Fabian Sharp, PA-C

## 2018-01-21 NOTE — Progress Notes (Signed)
Cardiology Office Note    Date:  01/22/2018   ID:  Melvin Marshall 02/17/1956, MRN [id number]  PCP:  [facility]  Cardiologist:  Melvin Majestic, MD   Chief Complaint  Patient presents with  . Follow-up   F/U cardiology evaluation.    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  I saw him for follow-up 1 week later.  He presents for one-month follow-up evaluation.  Melvin Marshall denies any awareness of prior cardiac disease.  In late August he began to notice some increasing shortness of breath and felt he was having some respiratory difficulties.  He went to urgent care, was given a Z-Pak and was started on empiric albuterol and received a Medrol Dosepak.  He did not have definitive benefit from this treatment.  He also admitted to postnasal drainage and has been taking some Claritin-D.  At the time he states his O2 saturation was 96% and his pulse was increased.  Over the past month he has noticed progressive shortness of breath particularly more with activity and with periods of agitation.  He also has noticed a vague chest pressure.  This past weekend while at work in the [employer] he was checked out apparently at the [employer].  He was not prescribed any therapy.  Because of increasing symptoms of shortness of breath, with development of PND orthopnea and at times feeling like he has to sit up at nighttime to breathe he walked into our on December 17, 2017 office  and was worked into my schedule and seen as an add-on.  When I initially saw him, I was concerned that his symptom complex was highly suggestive of acute systolic heart failure.  At the time, I did not know any laboratory status and in particular was unaware of his renal function.  He was tachycardic with a pulse of 111 and had a summation gallop in addition to 2-3+  pitting edema to below the knees.  I initiated carvedilol at 3.125 mg twice a day and started furosemide 40 mg daily.  He underwent an chest x-ray which showed right basilar infiltrate with associated effusion.  Laboratory revealed an elevated BNP at 1653.  Creatinine was 1.07.  Hemoglobin 14.6, hematocrit 45.9.  Lipid studies revealed total cholesterol 169, HDL 31, LDL 116 and triglycerides 109.  TSH was 4.7.  I recommended he return to the office 1 week later for reassessment.  When I saw him on December 24, 2017 he was feeling significantly improved and had lost 16 pounds of fluid.  His lower extremity edema was significantly better.  He was breathing better and deniedchest pain or palpitations.  During that evaluation I initiated Entresto 24/26 mg twice a day and recommended support stockings.  I had recommended that he see our pharmacist 1 week later and if he was doing well on Entresto that Spironolactone 12.5 mg may be able to be started at that time.  He was seen by Melvin Marshall subsequent to my office visit and during her evaluation his blood pressure was low at 96/62.  He was asymptomatic.  He was advised to reduce Lasix from 40 mg down to 20 mg and deferred initiation of spironolactone at that time.  Over the past several weeks, he states he continued to take the 40 mg Lasix dose since he still had fluid and  he has lost additional fluid.  In total he has lost 30 pounds since his initial evaluation from 213 down to 183 pounds.  He denies any dizziness or lightheadedness.  He denies chest pressure.  His leg edema has significantly improved although mild edema persists.  He presents for reevaluation.   Current Medications: Outpatient Medications Prior to Visit  Medication Sig Dispense Refill  . aspirin 81 MG chewable tablet Chew 81 mg by mouth daily.    . carvedilol (COREG) 3.125 MG tablet Take 1 tablet (3.125 mg total) by mouth 2 (two) times daily. 60 tablet 3  . COD LIVER OIL PO Take by mouth  daily.    [name] GARLIC PO Take by mouth.    . Omega-3 Fatty Acids (FISH OIL OMEGA-3 PO) Take by mouth daily.    . furosemide (LASIX) 40 MG tablet Take 1 tablet (40 mg total) by mouth daily. 90 tablet 3  . sacubitril-valsartan (ENTRESTO) 24-26 MG Take 1 tablet by mouth 2 (two) times daily. 60 tablet 5   No facility-administered medications prior to visit.      Allergies:   Patient has no known allergies.   Social History   Socioeconomic History  . Marital status: Married    Spouse name: Not on file  . Number of children: 4  . Years of education: Not on file  . Highest education level: Bachelor's degree (e.g., BA, AB, BS)  Occupational History  . Occupation: optometry    Comment: self employed  Social Needs  . Financial resource strain: Not on file  . Food insecurity:    Worry: Not on file    Inability: Not on file  . Transportation needs:    Medical: Not on file    Non-medical: Not on file  Tobacco Use  . Smoking status: Never Smoker  . Smokeless tobacco: Never Used  Substance and Sexual Activity  . Alcohol use: Not Currently  . Drug use: Never  . Sexual activity: Not on file  Lifestyle  . Physical activity:    Days per week: Not on file    Minutes per session: Not on file  . Stress: Not on file  Relationships  . Social connections:    Talks on phone: Not on file    Gets together: Not on file    Attends religious service: Not on file    Active member of club or organization: Not on file    Attends meetings of clubs or organizations: Not on file    Relationship status: Not on file  Other Topics Concern  . Not on file  Social History Narrative  . Not on file    Socially he was born in [place]. He did his undergraduate degree at [employer] and ultimately attended [employer] in [place].  He is married for 37 years.  There is no tobacco or alcohol use. He has 4 children.  Family History:  The patient's family history includes COPD in his father; Diabetes  type I in his child; Heart failure in his father; Liver disease in his mother; Suicidality in his paternal grandfather.   His mother died at age 85 and had liver disease from a transfusion, father died at 27 and had CHF and COPD.  He has 3 sisters ages 106, 102, and 38.  ROS General: Negative; No fevers, chills, or night sweats;  HEENT: Negative; No changes in vision or hearing, sinus congestion, difficulty swallowing Pulmonary: Negative; No cough, wheezing, shortness of breath, hemoptysis Cardiovascular:  See HPI GI: Negative; No nausea, vomiting, diarrhea, or abdominal pain GU: Negative; No dysuria, hematuria, or difficulty voiding Musculoskeletal: Negative; no myalgias, joint pain, or weakness Hematologic/Oncology: Negative; no easy bruising, bleeding Endocrine: Negative; no heat/cold intolerance; no diabetes Neuro: Negative; no changes in balance, headaches Skin: Negative; No rashes or skin lesions Psychiatric: Negative; No behavioral problems, depression Sleep: Negative; No snoring, daytime sleepiness, hypersomnolence, bruxism, restless legs, hypnogognic hallucinations, no cataplexy Other comprehensive 14 point system review is negative.   PHYSICAL EXAM:   VS:  BP 118/70   Pulse 85   Ht '5\' 6"'$  (1.676 m)   Wt 183 lb 12.8 oz (83.4 kg)   BMI 29.67 kg/m     Repeat blood pressure by me was 122/70 without orthostatic change  Wt Readings from Last 3 Encounters:  01/21/18 183 lb 12.8 oz (83.4 kg)  01/03/18 186 lb 6.4 oz (84.6 kg)  12/24/17 197 lb 6.4 oz (89.5 kg)    General: Alert, oriented, no distress.  Skin: normal turgor, no rashes, warm and dry HEENT: Normocephalic, atraumatic. Pupils equal round and reactive to light; sclera anicteric; extraocular muscles intact;  Nose without nasal septal hypertrophy Mouth/Parynx benign; Mallinpatti scale 3 Neck: No JVD, no carotid bruits; normal carotid upstroke Lungs: clear to ausculatation and percussion; no wheezing or rales Chest wall:  without tenderness to palpitation Heart: PMI not displaced, RRR, s1 s2 normal, 1/6 systolic murmur, no diastolic murmur, no rubs, gallops, thrills, or heaves Abdomen: soft, nontender; no hepatosplenomehaly, BS+; abdominal aorta nontender and not dilated by palpation. Back: no CVA tenderness Pulses 2+ Musculoskeletal: full range of motion, normal strength, no joint deformities Extremities: Trace -1+ pitting edema bilaterally;no clubbing cyanosis or edema, Homan's sign negative  Neurologic: grossly nonfocal; Cranial nerves grossly wnl Psychologic: Normal mood and affect   Studies/Labs Reviewed:   EKG:  EKG is ordered today.  ECG (independently read by me): Normal sinus rhythm at 85 bpm.  Right bundle branch block with repolarization changes.  Left anterior hemiblock.  No ectopy.  December 24, 2017 ECG (independently read by me): NSR at 94; RBBB with repolarization changes; QTc 512 msec  December 17, 2017 ECG (independently read by me): Sinus tachycardia at 111 bpm.  PVC.  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  PR interval 158 ms.  QTc interval 505 ms.    I personally reviewed the ECG that he had done at [facility] on December 15, 2017 which shows sinus rhythm at 98 with right bundle branch block and left anterior hemiblock; PVC.   Recent Labs: BMP Latest Ref Rng & Units 12/17/2017  Glucose 65 - 99 mg/dL 110(H)  BUN 8 - 27 mg/dL 30(H)  Creatinine 0.76 - 1.27 mg/dL 1.07  BUN/Creat Ratio 10 - 24 28(H)  Sodium 134 - 144 mmol/L 142  Potassium 3.5 - 5.2 mmol/L 4.4  Chloride 96 - 106 mmol/L 104  CO2 20 - 29 mmol/L 21  Calcium 8.6 - 10.2 mg/dL 9.0     Hepatic Function Latest Ref Rng & Units 12/17/2017  Total Protein 6.0 - 8.5 g/dL 6.5  Albumin 3.6 - 4.8 g/dL 4.1  AST 0 - 40 IU/L 22  ALT 0 - 44 IU/L 22  Alk Phosphatase 39 - 117 IU/L 53  Total Bilirubin 0.0 - 1.2 mg/dL 0.7    CBC Latest Ref Rng & Units 12/17/2017  WBC 3.4 - 10.8 x10E3/uL  4.3  Hemoglobin 13.0 - 17.7 g/dL 14.6  Hematocrit 37.5 - 51.0 %  45.9  Platelets 150 - 450 x10E3/uL 172   Lab Results  Component Value Date   MCV 83 12/17/2017   Lab Results  Component Value Date   TSH 4.700 (H) 12/17/2017   No results found for: HGBA1C   BNP    Component Value Date/Time   BNP 1,653.5 (H) 12/17/2017 1026    ProBNP No results found for: PROBNP   Lipid Panel     Component Value Date/Time   CHOL 169 12/17/2017 1026   TRIG 109 12/17/2017 1026   HDL 31 (L) 12/17/2017 1026   CHOLHDL 5.5 (H) 12/17/2017 1026   LDLCALC 116 (H) 12/17/2017 1026     RADIOLOGY: No results found.   Additional studies/ records that were reviewed today include:  Reviewed his laboratory, chest x-ray, and echo Doppler study.  ------------------------------------------------------------------- 12/21/2017 ECHO Study Conclusions  - Left ventricle: The cavity size was severely dilated. Wall   thickness was increased in a pattern of mild LVH. Systolic   function was severely reduced. The estimated ejection fraction   was in the range of 20% to 25%. Irregular apical contour, no   definite thrombus with Definity contrast. Severe global   hypokinesis with regional variation. The study is not technically   sufficient to allow evaluation of LV diastolic function. - Aortic valve: Trileaflet; mildly thickened leaflets. There was no   regurgitation. - Mitral valve: Mildly thickened leaflets . There was moderate   regurgitation. - Left atrium: Severely dilated. - Right ventricle: The cavity size was mildly dilated. Mild   systolic dysfunction. - Right atrium: The atrium was mildly dilated. - Tricuspid valve: There was moderate regurgitation. - Pulmonary arteries: PA peak pressure: 56 mm Hg (S). - Inferior vena cava: The vessel was dilated. The respirophasic   diameter changes were blunted (< 50%), consistent with elevated   central venous pressure. - Pericardium, extracardiac:  Small pericardial effusion (mostly   posterior).  Impressions:  - LVEF 20-25%, severely dilated LV with mild LVH, severe global   hypokinesis with regional variation, moderate MR, severe LAE,   mild RVE with mild RV systolic dysfunction, mild RAE, moderate   TR, RVSP 56 mmHg, dilated IVC, small mostly posterior pericardial   effusion.  ASSESSMENT:    1. Acute combined systolic and diastolic heart failure (HCC)   2. Paroxysmal nocturnal dyspnea   3. Bilateral leg edema   4. 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  He had developed  respiratory symptoms in late August and empirically was treated with a Z-Pak, albuterol, and Medrol Dosepak.  Subsequently, he developed progressive symptoms of shortness of breath.  When I initially saw him, he was having PND, orthopnea, had 2-3+ pitting edema, and a summation gallop on exam.  Subsequent evaluation has confirmed my suspicion for CHF.  His echo Doppler study reveals an EF of 20 to 25% with significant LV dilation at end diastole at 68 mm in end systole at 58 mm.  There was moderate MR.  He had severe left atrial dilatation, mild right atrial dilatation, moderate TR, and moderate pulmonary hypertension with a peak PA pressure of 56.  I recommended Lasix 40 mg and started him on low-dose carvedilol.  He  lost 16 pounds over the initial week of therapy.  When I saw him one week later I initiated Entresto 24/26 twice a day and recommended again support  stockings.  He continues to diurese well with his therapy and when seen 1 week later by Melvin Marshall had lost another 11 pounds.  Since initiation of therapy he has now lost 30 pounds of fluid.  He is breathing well.  He denies PND orthopnea.  He has continued to take the furosemide 40 mg despite being told to reduce it to 20 mg and is only taken 20 mg on a few days.  His blood  pressure today is stable.  I have recommended he definitely drop his Lasix dose to 20 mg.  I will further titrate Entresto to 49/51 mg twice a day.  His resting pulse today is 85.  Right bundle branch block with left anterior hemiblock is stable.  I will see him in approximately 6 to 8 weeks.  If he continues to do well further titration of carvedilol with possible addition of spironolactone will be implemented.  We discussed potential titration up to Entresto 97/103 mg twice a day for optimal benefit.  Once he is stable, we also discussed an ischemic evaluation and subsequent echocardiographic assessment.     Medication Adjustments/Labs and Tests Ordered: Current medicines are reviewed at length with the patient today.  Concerns regarding medicines are outlined above.  Medication changes, Labs and Tests ordered today are listed in the Patient Instructions below. Patient Instructions  Medication Instructions:  INCREASE Entresto to 49/51 mg two times daily DECREASE furosemide (Lasix) to 20 mg daily  Testing/Procedures: Your physician has recommended that you have a sleep study. This test records several body functions during sleep, including: brain activity, eye movement, oxygen and carbon dioxide blood levels, heart rate and rhythm, breathing rate and rhythm, the flow of air through your mouth and nose, snoring, body muscle movements, and chest and belly movement.  Follow-Up: At [facility], you and your health needs are our priority.  As part of our continuing mission to provide you with exceptional heart care, we have created designated Provider Care Teams.  These Care Teams include your primary Cardiologist (physician) and Advanced Practice Providers (APPs -  Physician Assistants and Nurse Practitioners) who all work together to provide you with the care you need, when you need it. You will need a follow up appointment in 6-8 weeks.  Please call our office 2 months in advance to schedule this  appointment.  You may see Dr. Claiborne Billings or one of the following Advanced Practice Providers on your designated Care Team: [place], [place] . Fabian Sharp, PA-C        Signed, Melvin Majestic, MD  01/22/2018 3:15 PM    [place] [address], [place], [place], [place]  [address] Phone: [phone]

## 2018-01-22 ENCOUNTER — Telehealth: Payer: Self-pay | Admitting: *Deleted

## 2018-01-22 ENCOUNTER — Encounter: Payer: Self-pay | Admitting: Cardiovascular Disease

## 2018-01-22 ENCOUNTER — Telehealth: Payer: Self-pay | Admitting: Cardiovascular Disease

## 2018-01-22 NOTE — Telephone Encounter (Signed)
Patient informed of sleep study appointment scheduled for 03/20/18. Per [employer] automated system no PA is required.

## 2018-01-22 NOTE — Telephone Encounter (Signed)
PATIENT CALLED STATES  [facility] HAS ONE PILL LEFT FOR TONIGHT OF ENTRESTO. PATIENT WILL NOT BE AVAILABLE  TO PICK UP  NEW DOSE 49/51 MG TABLETS UNTIL AFTER 4 PM 01/23/18.  PATIENT ANTED TO KNOW IF HE COULD MISS DOSE AN WHAT TO DO.  RN REVIEWED INFORMATION WITH [facility] PHARMACIST.- PER PHARMACIST, OKAY TO MISS DOSE - TAKE MEDICATION WHEN AVAILABLE AND KEEP ON SAME SCHEDULE . DO NOT TRY TO CATCH UP  THE MISS DOSE.   PATIENT VERBALIZED UNDERSTANDING.

## 2018-01-22 NOTE — Telephone Encounter (Signed)
-----   Message from Roland Earl sent at 01/22/2018  8:13 AM EST ----- Regarding: Sleep Study Ordered by Dr. Claiborne Billings

## 2018-01-22 NOTE — Telephone Encounter (Signed)
New Message:    Patient calling he is having some concerns about some medications that has been refilled.

## 2018-02-25 ENCOUNTER — Telehealth: Payer: Self-pay | Admitting: Cardiovascular Disease

## 2018-02-25 ENCOUNTER — Encounter: Payer: Self-pay | Admitting: *Deleted

## 2018-02-25 NOTE — Telephone Encounter (Signed)
Medication samples have been provided to the patient.  Drug name: Entresto 49/51 mg Qty: 28 tablets LOT: [id number]  Exp.Date: 03/22  Samples left at front desk for patient pick-up. Patient notified.

## 2018-02-25 NOTE — Telephone Encounter (Signed)
New Message   Patient calling the office for samples of medication:   1.  What medication and dosage are you requesting samples for? Entresto 49/51mg   2.  Are you currently out of this medication? Patient has one more dose

## 2018-03-07 ENCOUNTER — Encounter: Payer: Self-pay | Admitting: Cardiovascular Disease

## 2018-03-07 ENCOUNTER — Ambulatory Visit (INDEPENDENT_AMBULATORY_CARE_PROVIDER_SITE_OTHER): Payer: No Typology Code available for payment source | Admitting: Cardiovascular Disease

## 2018-03-07 VITALS — BP 115/78 | HR 96 | Ht 66.0 in | Wt 193.8 lb

## 2018-03-07 DIAGNOSIS — R600 Localized edema: Secondary | ICD-10-CM | POA: Diagnosis not present

## 2018-03-07 DIAGNOSIS — I4510 Unspecified right bundle-branch block: Secondary | ICD-10-CM | POA: Diagnosis not present

## 2018-03-07 DIAGNOSIS — I420 Dilated cardiomyopathy: Secondary | ICD-10-CM | POA: Diagnosis not present

## 2018-03-07 DIAGNOSIS — I5043 Acute on chronic combined systolic (congestive) and diastolic (congestive) heart failure: Secondary | ICD-10-CM | POA: Diagnosis not present

## 2018-03-07 MED ORDER — SPIRONOLACTONE 25 MG PO TABS: 12.5000 mg | ORAL_TABLET | Freq: Every day | ORAL | 3 refills | Status: DC

## 2018-03-07 MED ORDER — CARVEDILOL 6.25 MG PO TABS: 6.2500 mg | ORAL_TABLET | Freq: Two times a day (BID) | ORAL | 3 refills | Status: DC

## 2018-03-07 NOTE — Progress Notes (Addendum)
Cardiology Office Note    Date:  03/09/2018   ID:  Melvin, Marshall [birth year], MRN [id number]  PCP:  [facility]  Cardiologist:  Shelva Majestic, MD   Chief Complaint  Patient presents with  . Follow-up   F/U cardiology evaluation.    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  I saw him for follow-up 1 week later an last saw him 01/21/2018. He presents for F/U evaluation. He presents for one-month follow-up evaluation.  Melvin Marshall denies any awareness of prior cardiac disease.  In late August he began to notice some increasing shortness of breath and felt he was having some respiratory difficulties.  He went to urgent care, was given a Z-Pak and was started on empiric albuterol and received a Medrol Dosepak.  He did not have definitive benefit from this treatment.  He also admitted to postnasal drainage and has been taking some Claritin-D.  At the time he states his O2 saturation was 96% and his pulse was increased.  Over the past month he has noticed progressive shortness of breath particularly more with activity and with periods of agitation.  He also has noticed a vague chest pressure.  This past weekend while at work in the [employer] he was checked out apparently at the [employer].  He was not prescribed any therapy.  Because of increasing symptoms of shortness of breath, with development of PND orthopnea and at times feeling like he has to sit up at nighttime to breathe he walked into our on December 17, 2017 office  and was worked into my schedule and seen as an add-on.  When I initially saw him, I was concerned that his symptom complex was highly suggestive of acute systolic heart failure.  At the time, I did not know any laboratory status and in particular was unaware of his renal function.  He was tachycardic with a pulse of  111 and had a summation gallop in addition to 2-3+ pitting edema to below the knees.  I initiated carvedilol at 3.125 mg twice a day and started furosemide 40 mg daily.  He underwent an chest x-ray which showed right basilar infiltrate with associated effusion.  Laboratory revealed an elevated BNP at 1653.  Creatinine was 1.07.  Hemoglobin 14.6, hematocrit 45.9.  Lipid studies revealed total cholesterol 169, HDL 31, LDL 116 and triglycerides 109.  TSH was 4.7.  I recommended he return to the office 1 week later for reassessment.  When I saw him on December 24, 2017 he was feeling significantly improved and had lost 16 pounds of fluid.  His lower extremity edema was significantly better.  He was breathing better and deniedchest pain or palpitations.  During that evaluation I initiated Entresto 24/26 mg twice a day and recommended support stockings.  I had recommended that he see our pharmacist 1 week later and if he was doing well on Entresto that Spironolactone 12.5 mg may be able to be started at that time.  He was seen by Melvin Marshall subsequent to my office visit and during her evaluation his blood pressure was low at 96/62.  He was asymptomatic.  He was advised to reduce Lasix from 40 mg down to 20 mg and deferred initiation of spironolactone at that time.  I last saw him on January 21, 2018 time he was continuing to  take the 40 mg Lasix dose since he still had fluid and he has lost additional fluid.  In total he had lost 30 pounds since his initial evaluation from 213 down to 183 pounds.  He denied any dizziness or lightheadedness.  He denies chest pressure.  His leg edema has significantly improved although mild edema persists.  That evaluation, recommended he decrease furosemide down to 20 mg and further titrated Entresto to 49/51 mg twice a day.  His resting pulse was 85.  Over the last 6 weeks, he has continued to do well.  He is now walking at least a mile a day 4 days/week.  He denies any  lightheadedness or dizziness he is unaware of palpitations.  He admits to some weight gain over the holidays.  Upon further questioning, the patient snores.  At times his sleep is nonrestorative.  He notes frequent awakenings intermittently.  He denies any daytime sleepiness.  He presents for reevaluation.  Current Medications: Outpatient Medications Prior to Visit  Medication Sig Dispense Refill  . aspirin 81 MG chewable tablet Chew 81 mg by mouth daily.    . COD LIVER OIL PO Take by mouth daily.    . furosemide (LASIX) 20 MG tablet Take 1 tablet (20 mg total) by mouth daily. 90 tablet 3  . GARLIC PO Take by mouth.    . Omega-3 Fatty Acids (FISH OIL OMEGA-3 PO) Take by mouth daily.    . sacubitril-valsartan (ENTRESTO) 49-51 MG Take 1 tablet by mouth 2 (two) times daily. 60 tablet 3  . carvedilol (COREG) 3.125 MG tablet Take 1 tablet (3.125 mg total) by mouth 2 (two) times daily. 60 tablet 3   No facility-administered medications prior to visit.      Allergies:   Patient has no known allergies.   Social History   Socioeconomic History  . Marital status: Married    Spouse name: Not on file  . Number of children: 4  . Years of education: Not on file  . Highest education level: Bachelor's degree (e.g., BA, AB, BS)  Occupational History  . Occupation: optometry    Comment: self employed  Social Needs  . Financial resource strain: Not on file  . Food insecurity:    Worry: Not on file    Inability: Not on file  . Transportation needs:    Medical: Not on file    Non-medical: Not on file  Tobacco Use  . Smoking status: Never Smoker  . Smokeless tobacco: Never Used  Substance and Sexual Activity  . Alcohol use: Not Currently  . Drug use: Never  . Sexual activity: Not on file  Lifestyle  . Physical activity:    Days per week: Not on file    Minutes per session: Not on file  . Stress: Not on file  Relationships  . Social connections:    Talks on phone: Not on file    Gets  together: Not on file    Attends religious service: Not on file    Active member of club or organization: Not on file    Attends meetings of clubs or organizations: Not on file    Relationship status: Not on file  Other Topics Concern  . Not on file  Social History Narrative  . Not on file    Socially he was born in [place]. He did his undergraduate degree at [employer] and ultimately attended [employer] in [place].  He is married for 37 years.  There  is no tobacco or alcohol use. He has 4 children.  Family History:  The patient's family history includes COPD in his father; Diabetes type I in his child; Heart failure in his father; Liver disease in his mother; Suicidality in his paternal grandfather.   His mother died at age 83 and had liver disease from a transfusion, father died at 27 and had CHF and COPD.  He has 3 sisters ages 58, 25, and 8.  ROS General: Negative; No fevers, chills, or night sweats;  HEENT: Negative; No changes in vision or hearing, sinus congestion, difficulty swallowing Pulmonary: Negative; No cough, wheezing, shortness of breath, hemoptysis Cardiovascular: See HPI GI: Negative; No nausea, vomiting, diarrhea, or abdominal pain GU: Negative; No dysuria, hematuria, or difficulty voiding Musculoskeletal: Negative; no myalgias, joint pain, or weakness Hematologic/Oncology: Negative; no easy bruising, bleeding Endocrine: Negative; no heat/cold intolerance; no diabetes Neuro: Negative; no changes in balance, headaches Skin: Negative; No rashes or skin lesions Psychiatric: Negative; No behavioral problems, depression Sleep: Positive for snoring; intermittent frequent awakenings and nonrestorative sleep; no daytime sleepiness, hypersomnolence, bruxism, restless legs, hypnogognic hallucinations, no cataplexy Other comprehensive 14 point system review is negative.   PHYSICAL EXAM:   VS:  BP 115/78   Pulse 96   Ht _0  (1.676 m)   Wt 193 lb 12.8 oz  (87.9 kg)   BMI 31.28 kg/m     Repeat blood pressure by me was 122/80 without orthostatic change.  Wt Readings from Last 3 Encounters:  03/07/18 193 lb 12.8 oz (87.9 kg)  01/21/18 183 lb 12.8 oz (83.4 kg)  01/03/18 186 lb 6.4 oz (84.6 kg)   General: Alert, oriented, no distress.  Skin: normal turgor, no rashes, warm and dry HEENT: Normocephalic, atraumatic. Pupils equal round and reactive to light; sclera anicteric; extraocular muscles intact; Nose without nasal septal hypertrophy Mouth/Parynx benign; Mallinpatti scale 3 Neck: No JVD, no carotid bruits; normal carotid upstroke Lungs: clear to ausculatation and percussion; no wheezing or rales Chest wall: without tenderness to palpitation Heart: PMI not displaced, RRR, s1 s2 normal, 1/6 systolic murmur, no diastolic murmur, no rubs, gallops, thrills, or heaves Abdomen: soft, nontender; no hepatosplenomehaly, BS+; abdominal aorta nontender and not dilated by palpation. Back: no CVA tenderness Pulses 2+ Musculoskeletal: full range of motion, normal strength, no joint deformities Extremities: Trivial edema at the ankles; no clubbing cyanosis, Homan's sign negative  Neurologic: grossly nonfocal; Cranial nerves grossly wnl Psychologic: Normal mood and affect   Studies/Labs Reviewed:   EKG:  EKG is ordered today.  ECG (independently read by me): Sinus rhythm at 94,  RBBB, isolated PVC  January 21, 2018 ECG (independently read by me): Normal sinus rhythm at 85 bpm.  Right bundle branch block with repolarization changes.  Left anterior hemiblock.  No ectopy.  December 24, 2017 ECG (independently read by me): NSR at 94; RBBB with repolarization changes; QTc 512 msec  December 17, 2017 ECG (independently read by me): Sinus tachycardia at 111 bpm.  PVC.  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  PR interval 158 ms.  QTc interval 505 ms.    I personally reviewed the ECG that he had done at the [facility] on December 15, 2017 which shows sinus rhythm at 98 with right bundle branch block and left anterior hemiblock; PVC.   Recent Labs: BMP Latest Ref Rng & Units 12/17/2017  Glucose 65 - 99 mg/dL 110(H)  BUN 8 - 27 mg/dL 30(H)  Creatinine 0.76 -  1.27 mg/dL 1.07  BUN/Creat Ratio 10 - 24 28(H)  Sodium 134 - 144 mmol/L 142  Potassium 3.5 - 5.2 mmol/L 4.4  Chloride 96 - 106 mmol/L 104  CO2 20 - 29 mmol/L 21  Calcium 8.6 - 10.2 mg/dL 9.0     Hepatic Function Latest Ref Rng & Units 12/17/2017  Total Protein 6.0 - 8.5 g/dL 6.5  Albumin 3.6 - 4.8 g/dL 4.1  AST 0 - 40 IU/L 22  ALT 0 - 44 IU/L 22  Alk Phosphatase 39 - 117 IU/L 53  Total Bilirubin 0.0 - 1.2 mg/dL 0.7    CBC Latest Ref Rng & Units 12/17/2017  WBC 3.4 - 10.8 x10E3/uL 4.3  Hemoglobin 13.0 - 17.7 g/dL 14.6  Hematocrit 37.5 - 51.0 % 45.9  Platelets 150 - 450 x10E3/uL 172   Lab Results  Component Value Date   MCV 83 12/17/2017   Lab Results  Component Value Date   TSH 4.700 (H) 12/17/2017   No results found for: HGBA1C   BNP    Component Value Date/Time   BNP 1,653.5 (H) 12/17/2017 1026    ProBNP No results found for: PROBNP   Lipid Panel     Component Value Date/Time   CHOL 169 12/17/2017 1026   TRIG 109 12/17/2017 1026   HDL 31 (L) 12/17/2017 1026   CHOLHDL 5.5 (H) 12/17/2017 1026   LDLCALC 116 (H) 12/17/2017 1026     RADIOLOGY: No results found.   Additional studies/ records that were reviewed today include:  Reviewed his laboratory, chest x-ray, and echo Doppler study.  ------------------------------------------------------------------- 12/21/2017 ECHO Study Conclusions  - Left ventricle: The cavity size was severely dilated. Wall   thickness was increased in a pattern of mild LVH. Systolic   function was severely reduced. The estimated ejection fraction   was in the range of 20% to 25%. Irregular apical contour, no   definite thrombus with Definity contrast. Severe global    hypokinesis with regional variation. The study is not technically   sufficient to allow evaluation of LV diastolic function. - Aortic valve: Trileaflet; mildly thickened leaflets. There was no   regurgitation. - Mitral valve: Mildly thickened leaflets . There was moderate   regurgitation. - Left atrium: Severely dilated. - Right ventricle: The cavity size was mildly dilated. Mild   systolic dysfunction. - Right atrium: The atrium was mildly dilated. - Tricuspid valve: There was moderate regurgitation. - Pulmonary arteries: PA peak pressure: 56 mm Hg (S). - Inferior vena cava: The vessel was dilated. The respirophasic   diameter changes were blunted (< 50%), consistent with elevated   central venous pressure. - Pericardium, extracardiac: Small pericardial effusion (mostly   posterior).  Impressions:  - LVEF 20-25%, severely dilated LV with mild LVH, severe global   hypokinesis with regional variation, moderate MR, severe LAE,   mild RVE with mild RV systolic dysfunction, mild RAE, moderate   TR, RVSP 56 mmHg, dilated IVC, small mostly posterior pericardial   effusion.  ASSESSMENT:    1. Acute on chronic combined systolic and diastolic heart failure ([place])   2. Bilateral leg edema   3. Dilated cardiomyopathy ([place])   4. Leg edema   5. 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  He had developed  respiratory symptoms in late August and empirically was treated with a Z-Pak, albuterol, and Medrol Dosepak.  Subsequently, he developed progressive symptoms of  shortness of breath.  When I initially saw him, he was having PND, orthopnea, had 2-3+ pitting edema, and a summation gallop on exam.  Subsequent evaluation has confirmed my suspicion for CHF.  His echo Doppler study revealed an EF of 20 to 25% with significant LV dilation at end diastole at 68 mm in end systole at 58 mm.  There was moderate MR.   He had severe left atrial dilatation, mild right atrial dilatation, moderate TR, and moderate pulmonary hypertension with a peak PA pressure of 56.  I recommended Lasix 40 mg and started him on low-dose carvedilol.  He  lost 16 pounds over the initial week of therapy.  When I saw him one week later I initiated Entresto 24/26 twice a day and recommended again support stockings.  He continues to diurese well with his therapy and when seen 1 week later by Melvin Marshall had lost another 11 pounds.  When I last saw him, he had lost approximately 30 pounds of fluid and at that time recommended he reduce his furosemide to 20 mg and further titrated Entresto to 49/51 mg twice a day.  He continues to feel well and is now walking 1 mile at least 4 days/week.  His resting pulse today is in the 90s.  His blood pressure is stable.  I have now recommended further titration of carvedilol from 3.125 mg twice a day up to 6.25 mg twice a day.  After 10 days on the increased carvedilol dose I have then recommended he initiate low-dose spironolactone at 12.5 mg daily.  In 4 weeks he will undergo a comprehensive metabolic panel, proBNP as well as BNP blood test.  If his blood pressure tolerates the above changes at his next visit I will then further titrate Entresto to maximum 97/103 mg twice daily dosing.  Once he is stable at that time will plan for an ischemic evaluation and subsequent echocardiographic assessment.  In addition, with his congestive heart failure, as well as history of loud snoring and at times frequent awakenings with nonrestorative sleep, I will refer him ultimately for a sleep study to make certain he is not having significant obstructive sleep apnea or central apneic events or Cheyne-Stokes respirations often seen in patients with CHF.   Medication Adjustments/Labs and Tests Ordered: Current medicines are reviewed at length with the patient today.  Concerns regarding medicines are outlined above.  Medication  changes, Labs and Tests ordered today are listed in the Patient Instructions below. Patient Instructions  Medication Instructions:  Increase Carvedilol to 6.25 mg daily. Start (in 10 days) Spironolactone 12.5 mg daily.  If you need a refill on your cardiac medications before your next appointment, please call your pharmacy.   Lab work: IN 2 WEEKS, come for CMET, BMP, PROBNP If you have labs (blood work) drawn today and your tests are completely normal, you will receive your results only by: [name] MyChart Message (if you have MyChart) OR . A paper copy in the mail If you have any lab test that is abnormal or we need to change your treatment, we will call you to review the results.  Follow-Up: At [facility], you and your health needs are our priority.  As part of our continuing mission to provide you with exceptional heart care, we have created designated Provider Care Teams.  These Care Teams include your primary Cardiologist (physician) and Advanced Practice Providers (APPs -  Physician Assistants and Nurse Practitioners) who all work together to provide you with the care  you need, when you need it. You will need a follow up appointment in 6 weeks.  Please call our office 2 months in advance to schedule this appointment.  You may see Dr.Kelly or one of the following Advanced Practice Providers on your designated Care Team: Almyra Deforest, [place] . Fabian Sharp, PA-C       Signed, Shelva Majestic, MD  03/09/2018 11:10 AM    [facility] [address] [place], [place], [place]  [address] Phone: [phone]

## 2018-03-07 NOTE — Patient Instructions (Signed)
Medication Instructions:  Increase Carvedilol to 6.25 mg daily. Start (in 10 days) Spironolactone 12.5 mg daily.  If you need a refill on your cardiac medications before your next appointment, please call your pharmacy.   Lab work: IN 2 WEEKS, come for CMET, BMP, PROBNP If you have labs (blood work) drawn today and your tests are completely normal, you will receive your results only by: [name] MyChart Message (if you have MyChart) OR . A paper copy in the mail If you have any lab test that is abnormal or we need to change your treatment, we will call you to review the results.  Follow-Up: At [facility], you and your health needs are our priority.  As part of our continuing mission to provide you with exceptional heart care, we have created designated Provider Care Teams.  These Care Teams include your primary Cardiologist (physician) and Advanced Practice Providers (APPs -  Physician Assistants and Nurse Practitioners) who all work together to provide you with the care you need, when you need it. You will need a follow up appointment in 6 weeks.  Please call our office 2 months in advance to schedule this appointment.  You may see Dr.Kelly or one of the following Advanced Practice Providers on your designated Care Team: Almyra Deforest, [place] . Fabian Sharp, PA-C

## 2018-03-09 ENCOUNTER — Encounter: Payer: Self-pay | Admitting: Cardiovascular Disease

## 2018-03-20 ENCOUNTER — Encounter (HOSPITAL_BASED_OUTPATIENT_CLINIC_OR_DEPARTMENT_OTHER): Payer: No Typology Code available for payment source

## 2018-04-24 ENCOUNTER — Ambulatory Visit: Payer: No Typology Code available for payment source | Admitting: Cardiovascular Disease

## 2018-04-28 ENCOUNTER — Encounter (HOSPITAL_BASED_OUTPATIENT_CLINIC_OR_DEPARTMENT_OTHER): Payer: No Typology Code available for payment source

## 2018-05-22 ENCOUNTER — Ambulatory Visit: Payer: No Typology Code available for payment source | Admitting: Cardiovascular Disease

## 2018-06-16 ENCOUNTER — Encounter (HOSPITAL_BASED_OUTPATIENT_CLINIC_OR_DEPARTMENT_OTHER): Payer: No Typology Code available for payment source

## 2018-07-31 ENCOUNTER — Other Ambulatory Visit: Payer: Self-pay | Admitting: Cardiovascular Disease

## 2018-08-13 ENCOUNTER — Telehealth: Payer: Self-pay | Admitting: Cardiovascular Disease

## 2018-08-13 MED ORDER — ENTRESTO 49-51 MG PO TABS: 1.0000 | ORAL_TABLET | Freq: Two times a day (BID) | ORAL | 3 refills | Status: DC

## 2018-08-13 NOTE — Telephone Encounter (Signed)
Left message for pt to call.

## 2018-08-13 NOTE — Telephone Encounter (Signed)
New message:      Patient calling concering getting a authorization for some medication. Please call this patient back.

## 2018-08-13 NOTE — Telephone Encounter (Signed)
Spoke with pt, he is having trouble getting his refill of entresto from the patient assistance. Called [employer] @ [phone]. Refills given and e-sent to [facility]. They are to contact the patient once processed.

## 2018-08-13 NOTE — Telephone Encounter (Signed)
Follow up  ° ° °Pt is returning call  ° ° °Please call back  °

## 2018-08-15 ENCOUNTER — Telehealth: Payer: No Typology Code available for payment source | Admitting: Cardiovascular Disease

## 2018-09-02 ENCOUNTER — Telehealth: Payer: Self-pay | Admitting: Cardiovascular Disease

## 2018-09-02 NOTE — Telephone Encounter (Signed)
°*  STAT* If patient is at the pharmacy, call can be transferred to refill team.   1. Which medications need to be refilled? (please list name of each medication and dose if known)  sacubitril-valsartan (ENTRESTO) 49-51 MG  2. Which pharmacy/location (including street and city if local pharmacy) is medication to be sent to? [employer] [facility]  [phone]   3. Do they need a 30 day or 90 day supply? 30   Patient states there is an issue getting the RX to the mail order company. He contacted the office on 06/16, but the issue has not been resolved yet. The mail order pharmacy is not in his designated pharmacies yet  Patient can take the call in real time. If for any reason he does not pick up the first time, please try again right away and he will answer. It is critical that he gets this issue resolved today. He will be out of his medication in 1-2 days

## 2018-09-03 MED ORDER — ENTRESTO 49-51 MG PO TABS: 1.0000 | ORAL_TABLET | Freq: Two times a day (BID) | ORAL | 3 refills | Status: DC

## 2018-09-03 NOTE — Telephone Encounter (Signed)
Called patient and advised that I had samples available for pick-up. Patient verbalized understanding.

## 2018-09-03 NOTE — Telephone Encounter (Signed)
Called, [employer] patient assistance- they are needing the RX- advised that it was sent on 06/16- and confirmed by pharmacy. Rep said they did not see it, I advised of a fax number to send the printed RX too- [phone]- will check samples to see if we have any to give to patient while we get this ordered for him-   Will contact patient if so.

## 2018-09-22 ENCOUNTER — Encounter (HOSPITAL_BASED_OUTPATIENT_CLINIC_OR_DEPARTMENT_OTHER): Payer: No Typology Code available for payment source

## 2018-09-27 ENCOUNTER — Other Ambulatory Visit (HOSPITAL_COMMUNITY)
Admission: RE | Admit: 2018-09-27 | Discharge: 2018-09-27 | Disposition: A | Discharge status: Home / Self Care | Payer: No Typology Code available for payment source | Source: Ambulatory Visit | Attending: Cardiovascular Disease | Admitting: Cardiovascular Disease

## 2018-09-27 DIAGNOSIS — Z01812 Encounter for preprocedural laboratory examination: Secondary | ICD-10-CM | POA: Insufficient documentation

## 2018-09-27 DIAGNOSIS — Z20828 Contact with and (suspected) exposure to other viral communicable diseases: Secondary | ICD-10-CM | POA: Diagnosis not present

## 2018-09-28 LAB — SARS CORONAVIRUS 2 (TAT 6-24 HRS): SARS Coronavirus 2: NEGATIVE

## 2018-09-29 ENCOUNTER — Other Ambulatory Visit: Payer: Self-pay

## 2018-09-29 ENCOUNTER — Ambulatory Visit (HOSPITAL_BASED_OUTPATIENT_CLINIC_OR_DEPARTMENT_OTHER)
Payer: No Typology Code available for payment source | Attending: Cardiovascular Disease | Admitting: Cardiovascular Disease

## 2018-09-29 DIAGNOSIS — Z7982 Long term (current) use of aspirin: Secondary | ICD-10-CM | POA: Diagnosis not present

## 2018-09-29 DIAGNOSIS — R0600 Dyspnea, unspecified: Secondary | ICD-10-CM

## 2018-09-29 DIAGNOSIS — G4733 Obstructive sleep apnea (adult) (pediatric): Secondary | ICD-10-CM | POA: Insufficient documentation

## 2018-09-29 DIAGNOSIS — Z79899 Other long term (current) drug therapy: Secondary | ICD-10-CM | POA: Diagnosis not present

## 2018-09-29 DIAGNOSIS — I493 Ventricular premature depolarization: Secondary | ICD-10-CM | POA: Insufficient documentation

## 2018-09-29 DIAGNOSIS — R0902 Hypoxemia: Secondary | ICD-10-CM | POA: Insufficient documentation

## 2018-09-29 DIAGNOSIS — I5041 Acute combined systolic (congestive) and diastolic (congestive) heart failure: Secondary | ICD-10-CM

## 2018-09-30 NOTE — Procedures (Signed)
Patient Name: Melvin Marshall, Melvin Marshall Date: 09/29/2018 Gender: Male D.O.B: [birth year] Age (years): 75 Referring Provider: Shelva Majestic MD, ABSM Height (inches): 66 Interpreting Physician: Shelva Majestic MD, ABSM Weight (lbs): 200 RPSGT: Gwenyth Allegra BMI: 32 MRN: [id number] Neck Size: 16.50  CLINICAL INFORMATION Sleep Study Type: NPSG  Indication for sleep study: Congestive Heart Failure, snoring  Epworth Sleepiness Score: 2  SLEEP STUDY TECHNIQUE As per the AASM Manual for the Scoring of Sleep and Associated Events v2.3 (April 2016) with a hypopnea requiring 4% desaturations.  The channels recorded and monitored were frontal, central and occipital EEG, electrooculogram (EOG), submentalis EMG (chin), nasal and oral airflow, thoracic and abdominal wall motion, anterior tibialis EMG, snore microphone, electrocardiogram, and pulse oximetry.  MEDICATIONS     aspirin 81 MG chewable tablet             carvedilol (COREG) 6.25 MG tablet         COD LIVER OIL PO         furosemide (LASIX) 20 MG tablet (Expired)         GARLIC PO         Omega-3 Fatty Acids (FISH OIL OMEGA-3 PO)         sacubitril-valsartan (ENTRESTO) 49-51 MG         spironolactone (ALDACTONE) 25 MG tablet       Medications self-administered by patient taken the night of the study : N/A  SLEEP ARCHITECTURE The study was initiated at 10:06:10 PM and ended at 4:31:35 AM.  Sleep onset time was 47.3 minutes and the sleep efficiency was 78.4%%. The total sleep time was 302.1 minutes.  Stage REM latency was 65.0 minutes.  The patient spent 10.6%% of the night in stage N1 sleep, 60.3%% in stage N2 sleep, 0.0%% in stage N3 and 29.1% in REM.  Alpha intrusion was absent.  Supine sleep was 5.86%.  RESPIRATORY PARAMETERS The overall apnea/hypopnea index (AHI) was 15.7 per hour. The respiratory disturbace index (RDI) was 18.7/h. There were 13 total apneas, including 6 obstructive, 7 central and 0 mixed apneas.  There were 66 hypopneas and 15 RERAs.  The AHI during Stage REM sleep was 3.4 per hour.  AHI while supine was 44.1 per hour.  The mean oxygen saturation was 95.6%. The minimum SpO2 during sleep was 85.0%.  Loud snoring was noted during this study.  CARDIAC DATA The 2 lead EKG demonstrated sinus rhythm. The mean heart rate was 67.1 beats per minute. Other EKG findings include: PVCs.  LEG MOVEMENT DATA The total PLMS were 0 with a resulting PLMS index of 0.0. Associated arousal with leg movement index was 0.0.  IMPRESSIONS - Moderate obstructive sleep apnea occurred during this study (AHI 15.7/h, RDI 18.7/h). - No significant central sleep apnea occurred during this study (CAI = 1.4/h). - Mild oxygen desaturation to a nadir of 85.0%. - The patient snored with loud snoring volume. - EKG findings include PVCs. - Clinically significant periodic limb movements did not occur during sleep. No significant associated arousals.  DIAGNOSIS - Obstructive Sleep Apnea (327.23 [G47.33 ICD-10]) - Nocturnal Hypoxemia (327.26 [G47.36 ICD-10])  RECOMMENDATIONS - In this patient with significant CHF, recommend therapeutic CPAP titration to determine optimal pressure required to alleviate sleep disordered breathing. - Effort should be made to optimize nasal and oropharngeal patnecy. - Positional therapy avoiding supine position during sleep. - Avoid alcohol, sedatives and other CNS depressants that may worsen sleep apnea and disrupt normal sleep architecture. - Sleep hygiene should  be reviewed to assess factors that may improve sleep quality. - Weight management (BMI 32) and regular exercise should be initiated or continued if appropriate.  [Electronically signed] 09/30/2018 04:40 PM  Shelva Majestic MD, [facility], ABSM Diplomate, American Board of Sleep Medicine   NPI: [id number] [place] PH: [phone]   FX: [phone] [place]

## 2018-10-01 ENCOUNTER — Telehealth: Payer: Self-pay | Admitting: *Deleted

## 2018-10-01 LAB — COMPREHENSIVE METABOLIC PANEL
ALT: 12 IU/L (ref 0–44)
AST: 14 IU/L (ref 0–40)
Albumin/Globulin Ratio: 1.4 (ref 1.2–2.2)
Albumin: 4.1 g/dL (ref 3.8–4.8)
Alkaline Phosphatase: 61 IU/L (ref 39–117)
BUN/Creatinine Ratio: 17 (ref 10–24)
BUN: 17 mg/dL (ref 8–27)
Bilirubin Total: 0.3 mg/dL (ref 0.0–1.2)
CO2: 24 mmol/L (ref 20–29)
Calcium: 8.8 mg/dL (ref 8.6–10.2)
Chloride: 103 mmol/L (ref 96–106)
Creatinine, Ser: 0.98 mg/dL (ref 0.76–1.27)
GFR calc Af Amer: 96 mL/min/{1.73_m2} (ref >59)
GFR calc non Af Amer: 83 mL/min/{1.73_m2} (ref >59)
Globulin, Total: 2.9 g/dL (ref 1.5–4.5)
Glucose: 86 mg/dL (ref 65–99)
Potassium: 4.2 mmol/L (ref 3.5–5.2)
Sodium: 141 mmol/L (ref 134–144)
Total Protein: 7 g/dL (ref 6.0–8.5)

## 2018-10-01 LAB — PRO B NATRIURETIC PEPTIDE: NT-Pro BNP: 760 pg/mL — ABNORMAL HIGH (ref 0–210)

## 2018-10-01 NOTE — Telephone Encounter (Signed)
-----   Message from Troy Sine, MD sent at 09/30/2018  4:51 PM EDT ----- Mariann Laster, please notify pt and schedule for CPAP titration

## 2018-10-01 NOTE — Telephone Encounter (Signed)
Patient notified of sleep study results and recommendations. He voiced understanding and would like for me to ask Dr Claiborne Billings if he can do APAP due to all costs will be out of pocket. His insurance is self funded. Message routed to Dr Claiborne Billings for review and recommendations.

## 2018-10-02 NOTE — Telephone Encounter (Signed)
With his history of significant CHF ideally recommended in lab CPAP titration due to potential for central events and optimal titration.

## 2018-10-08 ENCOUNTER — Other Ambulatory Visit: Payer: Self-pay | Admitting: Cardiovascular Disease

## 2018-10-08 ENCOUNTER — Telehealth: Payer: Self-pay | Admitting: *Deleted

## 2018-10-08 DIAGNOSIS — G4733 Obstructive sleep apnea (adult) (pediatric): Secondary | ICD-10-CM

## 2018-10-08 NOTE — Telephone Encounter (Signed)
Patient notified per Dr Claiborne Billings he prefers that he does in lab CPAP titration study versus APAP due to his history of CHF. patient voiced verbal inderstanding and request the test be scheduled in late September or early October. Appointment scheduled for September 20th with COVID appointment scheduled for September 18th @ 3:15pm. Patient notified of both appointments.

## 2018-10-18 ENCOUNTER — Telehealth: Payer: Self-pay

## 2018-10-18 NOTE — Telephone Encounter (Signed)
Called patient to notify that visit with Dr.Kelly on 08/25 is a virtual visit and not a in office visit. Left voicemail and call back number if questions.

## 2018-10-22 ENCOUNTER — Telehealth: Payer: Self-pay | Admitting: Cardiovascular Disease

## 2018-10-22 ENCOUNTER — Telehealth (INDEPENDENT_AMBULATORY_CARE_PROVIDER_SITE_OTHER): Payer: No Typology Code available for payment source | Admitting: Cardiovascular Disease

## 2018-10-22 ENCOUNTER — Encounter: Payer: Self-pay | Admitting: Cardiovascular Disease

## 2018-10-22 VITALS — BP 120/68 | HR 61 | Ht 66.0 in | Wt 200.0 lb

## 2018-10-22 DIAGNOSIS — I420 Dilated cardiomyopathy: Secondary | ICD-10-CM

## 2018-10-22 DIAGNOSIS — G4733 Obstructive sleep apnea (adult) (pediatric): Secondary | ICD-10-CM

## 2018-10-22 DIAGNOSIS — I5041 Acute combined systolic (congestive) and diastolic (congestive) heart failure: Secondary | ICD-10-CM | POA: Diagnosis not present

## 2018-10-22 DIAGNOSIS — R600 Localized edema: Secondary | ICD-10-CM

## 2018-10-22 DIAGNOSIS — I4510 Unspecified right bundle-branch block: Secondary | ICD-10-CM

## 2018-10-22 MED ORDER — SACUBITRIL-VALSARTAN 97-103 MG PO TABS: 1.0000 | ORAL_TABLET | Freq: Two times a day (BID) | ORAL | 3 refills | Status: DC

## 2018-10-22 NOTE — Telephone Encounter (Signed)
LVM for patient to call and schedule Dr. Evette Georges followup in November and his echo in October.

## 2018-10-22 NOTE — Progress Notes (Signed)
Virtual Visit via Telephone Note   This visit type was conducted due to national recommendations for restrictions regarding the COVID-19 Pandemic (e.g. social distancing) in an effort to limit this patient's exposure and mitigate transmission in our community.  Due to his co-morbid illnesses, this patient is at least at moderate risk for complications without adequate follow up.  This format is felt to be most appropriate for this patient at this time.  The patient did not have access to video technology/had technical difficulties with video requiring transitioning to audio format only (telephone).  All issues noted in this document were discussed and addressed.  No physical exam could be performed with this format.  Please refer to the patient's chart for his  consent to telehealth for [facility].   Date:  10/22/2018   ID:  Melvin Marshall, Melvin Marshall [birth year], MRN [id number]  Patient Location: Home Provider Location: Home  PCP:  [facility]  Cardiologist:  Shelva Majestic, MD Electrophysiologist:  None   Evaluation Performed:  Follow-Up Visit  Chief Complaint:  7 month F/U  History of Present Illness:    Melvin Marshall is a 63 y.o. male who presents for 21-month follow-up cardiology evaluation for his cardiomyopathy as well as recent sleep evaluation.    Melvin Marshall denied any awareness of prior cardiac disease but in late August 2019 he began to notice some increasing shortness of breath and felt he was having some respiratory difficulties.  He went to urgent care, was given a Z-Pak and was started on empiric albuterol and received a Medrol Dosepak.  He did not have definitive benefit from this treatment.  He also admitted to postnasal drainage and has been taking some Claritin-D.  At the time he states his O2 saturation was 96% and his pulse was increased.  Over the past month he has noticed progressive shortness of breath particularly more with activity and with periods of  agitation.  He also has noticed a vague chest pressure.  This past weekend while at work in the [employer] he was checked out apparently at the [employer].  He was not prescribed any therapy.  Because of increasing symptoms of shortness of breath, with development of PND orthopnea and at times feeling like he has to sit up at nighttime to breathe he walked into our on December 17, 2017 office  and was worked into my schedule and seen as an add-on.  When I initially saw him, I was concerned that his symptom complex was highly suggestive of acute systolic heart failure.  At the time, I did not know any laboratory status and in particular was unaware of his renal function.  He was tachycardic with a pulse of 111 and had a summation gallop in addition to 2-3+ pitting edema to below the knees.  I initiated carvedilol at 3.125 mg twice a day and started furosemide 40 mg daily.  He underwent an chest x-ray which showed right basilar infiltrate with associated effusion.  Laboratory revealed an elevated BNP at 1653.  Creatinine was 1.07.  Hemoglobin 14.6, hematocrit 45.9.  Lipid studies revealed total cholesterol 169, HDL 31, LDL 116 and triglycerides 109.  TSH was 4.7.  I recommended he return to the office 1 week later for reassessment.  When I saw him on December 24, 2017 he was feeling significantly improved and had lost 16 pounds of fluid.  His lower extremity edema was significantly better.  He was breathing better and deniedchest pain  or palpitations.  During that evaluation I initiated Entresto 24/26 mg twice a day and recommended support stockings.  I had recommended that he see our pharmacist 1 week later and if he was doing well on Entresto that Spironolactone 12.5 mg may be able to be started at that time.  He was seen by Melvin Marshall subsequent to my office visit and during her evaluation his blood pressure was low at 96/62.  He was asymptomatic.  He was advised to reduce Lasix from 40 mg down to  20 mg and deferred initiation of spironolactone at that time.  I last saw him on January 21, 2018 time he was continuing to take the 40 mg Lasix dose since he still had fluid and he has lost additional fluid.  In total he had lost 30 pounds since his initial evaluation from 213 down to 183 pounds.  He denied any dizziness or lightheadedness.  He denies chest pressure.  His leg edema has significantly improved although mild edema persists.  That evaluation, recommended he decrease furosemide down to 20 mg and further titrated Entresto to 49/51 mg twice a day.  His resting pulse was 85.  I last saw him in the office in January 2020 and he continued to feel significantly improved and felt  well. He was now walking at least a mile a day 4 days/week.  He denied any lightheadedness or dizziness and was unaware of palpitations.  During that evaluation I further titrated carvedilol to 6.25 mg twice a day and recommended 10 days later he initiate spironolactone 12.5 mg daily.  He was continuing to take Entresto 49/51 mg twice a day and there were plans to in the future further titrate this to maximum dosing at 97/103 mg twice daily.  However, due to the COVID-19 pandemic, he has had not had subsequent follow-up.    He  underwent a sleep study due to my concerns for obstructive sleep apnea on September 29, 2018 and this demonstrated at least moderate obstructive sleep apnea with an overall AHI of 15.7 and RDI of 18.7/h.  Sleep apnea was very severe with supine sleep with an AHI of 44.1/h.  He had loud snoring and oxygen nadir was 85%.  He has not yet undergone his CPAP titration and due to his significant reduction in LV function with EF of 20 to 25% prior to instituting Entresto, I had recommended an in lab titration in the event he had central apneas and BiPAP  was necessary.  Since I last saw him, he does admit to some weight gain.  With the [place] pandemic he has not been walking as much as he had previously but he  is still walking a total of approximately 8 miles per week up to 4 days/week at variable distances.  He states his blood pressure has been fairly controlled and typically is in the 120s with his resting pulse typically in the 60s.  He admits to some weight gain since his last visit which may be contributed by less diligence and may be some increased sodium intake.  He denies chest pain.  He denies palpitations.  He denies PND orthopnea.  He presents for reevaluation.   The patient does not have symptoms concerning for COVID-19 infection (fever, chills, cough, or new shortness of breath).    No past medical history on file. No past surgical history on file.   Current Meds  Medication Sig   aspirin 81 MG chewable tablet Chew 81 mg by mouth daily.  carvedilol (COREG) 6.25 MG tablet TAKE 1 TABLET BY MOUTH TWICE A DAY   spironolactone (ALDACTONE) 25 MG tablet Take 0.5 tablets (12.5 mg total) by mouth daily.   [DISCONTINUED] sacubitril-valsartan (ENTRESTO) 49-51 MG Take 1 tablet by mouth 2 (two) times daily.     Allergies:   Patient has no known allergies.   Social History   Tobacco Use   Smoking status: Never Smoker   Smokeless tobacco: Never Used  Substance Use Topics   Alcohol use: Not Currently   Drug use: Never     Family Hx: The patient's family history includes COPD in his father; Diabetes type I in his child; Heart failure in his father; Liver disease in his mother; Suicidality in his paternal grandfather.  ROS:   Please see the history of present illness.    He denies any fevers chills or night sweats No cough, wheezing or shortness of breath No chest pain or palpitations No bleeding Nausea and diarrhea No myalgias or joint pain At times there is trace ankle edema No headaches No rashes Positive for snoring. All other systems reviewed and are negative.   Prior CV studies:   The following studies were reviewed  today:  ------------------------------------------------------------------- ECHO 12/21/2017 Study Conclusions  - Left ventricle: The cavity size was severely dilated. Wall   thickness was increased in a pattern of mild LVH. Systolic   function was severely reduced. The estimated ejection fraction   was in the range of 20% to 25%. Irregular apical contour, no   definite thrombus with Definity contrast. Severe global   hypokinesis with regional variation. The study is not technically   sufficient to allow evaluation of LV diastolic function. - Aortic valve: Trileaflet; mildly thickened leaflets. There was no   regurgitation. - Mitral valve: Mildly thickened leaflets . There was moderate   regurgitation. - Left atrium: Severely dilated. - Right ventricle: The cavity size was mildly dilated. Mild   systolic dysfunction. - Right atrium: The atrium was mildly dilated. - Tricuspid valve: There was moderate regurgitation. - Pulmonary arteries: PA peak pressure: 56 mm Hg (S). - Inferior vena cava: The vessel was dilated. The respirophasic   diameter changes were blunted (< 50%), consistent with elevated   central venous pressure. - Pericardium, extracardiac: Small pericardial effusion (mostly   posterior).  Impressions:  - LVEF 20-25%, severely dilated LV with mild LVH, severe global   hypokinesis with regional variation, moderate MR, severe LAE,   mild RVE with mild RV systolic dysfunction, mild RAE, moderate   TR, RVSP 56 mmHg, dilated IVC, small mostly posterior pericardial   effusion.   Sleep Study 09/29/2018 SLEEP ARCHITECTURE The study was initiated at 10:06:10 PM and ended at 4:31:35 AM.  Sleep onset time was 47.3 minutes and the sleep efficiency was 78.4%%. The total sleep time was 302.1 minutes.  Stage REM latency was 65.0 minutes.  The patient spent 10.6%% of the night in stage N1 sleep, 60.3%% in stage N2 sleep, 0.0%% in stage N3 and 29.1% in REM.  Alpha  intrusion was absent.  Supine sleep was 5.86%.  RESPIRATORY PARAMETERS The overall apnea/hypopnea index (AHI) was 15.7 per hour. The respiratory disturbace index (RDI) was 18.7/h. There were 13 total apneas, including 6 obstructive, 7 central and 0 mixed apneas. There were 66 hypopneas and 15 RERAs.  The AHI during Stage REM sleep was 3.4 per hour.  AHI while supine was 44.1 per hour.  The mean oxygen saturation was 95.6%. The minimum SpO2 during sleep  was 85.0%.  Loud snoring was noted during this study.  CARDIAC DATA The 2 lead EKG demonstrated sinus rhythm. The mean heart rate was 67.1 beats per minute. Other EKG findings include: PVCs.  LEG MOVEMENT DATA The total PLMS were 0 with a resulting PLMS index of 0.0. Associated arousal with leg movement index was 0.0.  IMPRESSIONS - Moderate obstructive sleep apnea occurred during this study (AHI 15.7/h, RDI 18.7/h). - No significant central sleep apnea occurred during this study (CAI = 1.4/h). - Mild oxygen desaturation to a nadir of 85.0%. - The patient snored with loud snoring volume. - EKG findings include PVCs. - Clinically significant periodic limb movements did not occur during sleep. No significant associated arousals.  DIAGNOSIS - Obstructive Sleep Apnea (327.23 [G47.33 ICD-10]) - Nocturnal Hypoxemia (327.26 [G47.36 ICD-10])  RECOMMENDATIONS - In this patient with significant CHF, recommend therapeutic CPAP titration to determine optimal pressure required to alleviate sleep disordered breathing. - Effort should be made to optimize nasal and oropharngeal patnecy. - Positional therapy avoiding supine position during sleep. - Avoid alcohol, sedatives and other CNS depressants that may worsen sleep apnea and disrupt normal sleep architecture. - Sleep hygiene should be reviewed to assess factors that may improve sleep quality. - Weight management (BMI 32) and regular exercise should be initiated or continued if  appropriate.  [Electronically signed] 09/30/2018 04:40 PM   Labs/Other Tests and Data Reviewed:    EKG: I reviewed his last ECG of March 07, 2018 which showed sinus rhythm at 94, right bundle branch block, and an isolated PVC.  Recent Labs: 12/17/2017: BNP 1,653.5; Hemoglobin 14.6; Magnesium 2.1; Platelets 172; TSH 4.700 09/30/2018: ALT 12; BUN 17; Creatinine, Ser 0.98; NT-Pro BNP 760; Potassium 4.2; Sodium 141   Recent Lipid Panel Lab Results  Component Value Date/Time   CHOL 169 12/17/2017 10:26 AM   TRIG 109 12/17/2017 10:26 AM   HDL 31 (L) 12/17/2017 10:26 AM   CHOLHDL 5.5 (H) 12/17/2017 10:26 AM   LDLCALC 116 (H) 12/17/2017 10:26 AM    Wt Readings from Last 3 Encounters:  10/22/18 200 lb (90.7 kg)  09/29/18 200 lb (90.7 kg)  03/07/18 193 lb 12.8 oz (87.9 kg)     Objective:    Vital Signs:  BP 120/68    Pulse 61    Ht 5\' 6"  (1.676 m)    Wt 200 lb (90.7 kg)    BMI 32.28 kg/m    Since this was a virtual phone visit I could not visually inspect him. However, his breathing was normal and not labored He denied any pulse irregularity He did not have any chest wall soreness or tenderness to his palpation There is no audible wheezing He denied any abdominal tenderness There was no significant leg swelling noted by him today He denied any neurologic symptoms He had a normal affect and cognition   ASSESSMENT & PLAN:    1. Acute on chronic combined systolic and diastolic heart failure: His echo Doppler study was again reviewed from October 2019 prior to institution of therapy.  Clinically he notes marked benefit with initiation of carvedilol, spironolactone, and Entresto currently at 49/51 mg twice a day.  His blood pressure is stable he denies any orthostatic symptoms.  I discussed with him plans to titrate up to 97/103 mg twice daily.  However he tells me he has several bottles of 49/51 remaining.  For this reason for the next month we will slowly titrate him and he will  take 2 tablets  Morning and continue to take 1 tablet of the 49/51 mg dose in the evening.  If his blood pressure tolerates this mild adjustment and after 1 month he will fill a new prescription for 97/103 mg twice daily.  Ultimately, in October he will undergo a follow-up echo Doppler study to reassess any potential improvement in LV function and it may be worthwhile at that time to consider potential ischemic evaluation. 2. Obstructive sleep apnea: I thoroughly reviewed his diagnostic polysomnogram which confirms overall moderate sleep apnea but very severe sleep apnea with supine position.  With his CHF history, and the fact that he was noted to have small amount of central apneas on his diagnostic study, I have recommended an in lab CPAP titration rather than AutoPap therapy in the event central events are noted and he may require BiPAP.  I will try to do this sleep study on his increased Entresto dose for optimization of potential heart failure.  He is tentatively scheduled to undergo his CPAP titration in lab in the latter portion of September 2020. 3. Intermittent leg edema: Markedly improved.  He is on spironolactone 12.5 mg daily.  We again discussed more diligence with sodium restriction. 4. Right bundle branch block: Noted on ECG.  No new ECG obtained today.  COVID-19 Education: The signs and symptoms of COVID-19 were discussed with the patient and how to seek care for testing (follow up with PCP or arrange E-visit).  The importance of social distancing was discussed today.  Time:   Today, I have spent 28 minutes with the patient with telehealth technology discussing the above problems.     Medication Adjustments/Labs and Tests Ordered: Current medicines are reviewed at length with the patient today.  Concerns regarding medicines are outlined above.   Tests Ordered: Orders Placed This Encounter  Procedures   ECHOCARDIOGRAM COMPLETE    Medication Changes: Meds ordered this encounter   Medications   sacubitril-valsartan (ENTRESTO) 97-103 MG    Sig: Take 1 tablet by mouth 2 (two) times daily.    Dispense:  60 tablet    Refill:  3    Follow Up: In late September 2020 he will undergo his in lab CPAP titration; a follow-up echo Doppler study will be scheduled for mid October 2020 with follow-up office visit in November following CPAP and further evaluation of his cardiomyopathy following his echo Doppler assessment.  Signed, Shelva Majestic, MD  10/22/2018 1:19 PM    [place]

## 2018-10-22 NOTE — Patient Instructions (Signed)
Medication Instructions:  Take 2 tablets of 49/51 of Entresto in the morning, and 1 tablet of 49/51 Entresto at night for only this month.  Then start the Entresto 97/103 twice daily. If you need a refill on your cardiac medications before your next appointment, please call your pharmacy.   Testing/Procedures: Echocardiogram in October - Your physician has requested that you have an echocardiogram. Echocardiography is a painless test that uses sound waves to create images of your heart. It provides your doctor with information about the size and shape of your heart and how well your heart's chambers and valves are working. This procedure takes approximately one hour. There are no restrictions for this procedure. This will be performed at our [facility] location - [address].   Follow-Up: At [facility], you and your health needs are our priority.  As part of our continuing mission to provide you with exceptional heart care, we have created designated Provider Care Teams.  These Care Teams include your primary Cardiologist (physician) and Advanced Practice Providers (APPs -  Physician Assistants and Nurse Practitioners) who all work together to provide you with the care you need, when you need it. You will need a follow up appointment in 3 months. You may see Dr.Kelly or one of the following Advanced Practice Providers on your designated Care Team: Almyra Deforest, [place] . Fabian Sharp, PA-C    2 [phone] in the morning, and 1 49/51 at night for only this month 97/103 twice daily next month November follow up- after titration ECHO in October

## 2018-11-15 ENCOUNTER — Other Ambulatory Visit (HOSPITAL_COMMUNITY): Payer: No Typology Code available for payment source

## 2018-11-15 ENCOUNTER — Other Ambulatory Visit (HOSPITAL_COMMUNITY)
Admission: RE | Admit: 2018-11-15 | Discharge: 2018-11-15 | Disposition: A | Discharge status: Home / Self Care | Payer: No Typology Code available for payment source | Source: Ambulatory Visit | Attending: Cardiovascular Disease | Admitting: Cardiovascular Disease

## 2018-11-15 DIAGNOSIS — Z01812 Encounter for preprocedural laboratory examination: Secondary | ICD-10-CM | POA: Diagnosis not present

## 2018-11-15 DIAGNOSIS — Z20828 Contact with and (suspected) exposure to other viral communicable diseases: Secondary | ICD-10-CM | POA: Insufficient documentation

## 2018-11-15 LAB — SARS CORONAVIRUS 2 (TAT 6-24 HRS): SARS Coronavirus 2: NEGATIVE

## 2018-11-17 ENCOUNTER — Ambulatory Visit (HOSPITAL_BASED_OUTPATIENT_CLINIC_OR_DEPARTMENT_OTHER)
Payer: No Typology Code available for payment source | Attending: Cardiovascular Disease | Admitting: Cardiovascular Disease

## 2018-11-17 ENCOUNTER — Other Ambulatory Visit: Payer: Self-pay

## 2018-11-17 DIAGNOSIS — Z7982 Long term (current) use of aspirin: Secondary | ICD-10-CM | POA: Diagnosis not present

## 2018-11-17 DIAGNOSIS — Z79899 Other long term (current) drug therapy: Secondary | ICD-10-CM | POA: Insufficient documentation

## 2018-11-17 DIAGNOSIS — G4733 Obstructive sleep apnea (adult) (pediatric): Secondary | ICD-10-CM | POA: Diagnosis not present

## 2018-11-22 ENCOUNTER — Encounter (HOSPITAL_BASED_OUTPATIENT_CLINIC_OR_DEPARTMENT_OTHER): Payer: Self-pay | Admitting: Cardiovascular Disease

## 2018-11-22 NOTE — Procedures (Signed)
Patient Name: Melvin Marshall, Melvin Marshall Date: 11/17/2018 Gender: Male D.O.B: 07/06/1955 Age (years): 62 Referring Provider: Shelva Majestic MD, ABSM Height (inches): 66 Interpreting Physician: Shelva Majestic MD, ABSM Weight (lbs): 200 RPSGT: Baxter Flattery BMI: 32 MRN: [id number] Neck Size: 16.50  CLINICAL INFORMATION The patient is referred for a CPAP titration to treat sleep apnea.  Date of NPSG:  09/29/2018: AHI 15.7/h; RDI 18.7/h; supine AHI 44.1/h; O2 nadie 85%.  SLEEP STUDY TECHNIQUE As per the AASM Manual for the Scoring of Sleep and Associated Events v2.3 (April 2016) with a hypopnea requiring 4% desaturations.  The channels recorded and monitored were frontal, central and occipital EEG, electrooculogram (EOG), submentalis EMG (chin), nasal and oral airflow, thoracic and abdominal wall motion, anterior tibialis EMG, snore microphone, electrocardiogram, and pulse oximetry. Continuous positive airway pressure (CPAP) was initiated at the beginning of the study and titrated to treat sleep-disordered breathing.  MEDICATIONS     aspirin 81 MG chewable tablet             carvedilol (COREG) 6.25 MG tablet         COD LIVER OIL PO         furosemide (LASIX) 20 MG tablet (Expired)         Omega-3 Fatty Acids (FISH OIL OMEGA-3 PO)         sacubitril-valsartan (ENTRESTO) 97-103 MG         spironolactone (ALDACTONE) 25 MG tablet (Expired)      Medications self-administered by patient taken the night of the study : N/A  TECHNICIAN COMMENTS Comments added by technician: Patient had difficulty initiating sleep. F&P VITERA FULL FACE MASK MEDIUM WAS USED FOR THIS STUDY Comments added by scorer: N/A  RESPIRATORY PARAMETERS Optimal PAP Pressure (cm): 11 AHI at Optimal Pressure (/hr): 0.0 Overall Minimal O2 (%): 90.0 Supine % at Optimal Pressure (%): 0 Minimal O2 at Optimal Pressure (%): 94.0   SLEEP ARCHITECTURE The study was initiated at 10:59:11 PM and ended at 5:01:02 AM.  Sleep  onset time was 4.7 minutes and the sleep efficiency was 86.5%%. The total sleep time was 313.1 minutes.  The patient spent 2.7%% of the night in stage N1 sleep, 75.9%% in stage N2 sleep, 0.0%% in stage N3 and 21.4% in REM.Stage REM latency was 92.0 minutes  Wake after sleep onset was 44.0. Alpha intrusion was absent. Supine sleep was 16.45%.  CARDIAC DATA The 2 lead EKG demonstrated sinus rhythm. The mean heart rate was 66.0 beats per minute. Other EKG findings include: PVCs.  LEG MOVEMENT DATA The total Periodic Limb Movements of Sleep (PLMS) were 0. The PLMS index was 0.0. A PLMS index of <15 is considered normal in adults.  IMPRESSIONS - CPAP was initiated at 5 cm and was titrated to11 cm of water. At 10 cm patient slept for 52 minutes in REM sleep; AHI 0 and O2 nadir 94%. AHI at 11 cm is 0.  - Central sleep apnea was not noted during this titration (CAI = 0.8/h). - Significant oxygen desaturations were not observed during this titration (min O2 = 90.0%). - No snoring was audible during this study. - 2-lead EKG demonstrated: PVCs - Clinically significant periodic limb movements were not noted during this study. Arousals associated with PLMs were rare.  DIAGNOSIS - Obstructive Sleep Apnea (327.23 [G47.33 ICD-10])  RECOMMENDATIONS - Recommend an initial trial of CPAP therapy with EPR at 10 cm H2O with heated humidification. A Medium size Fisher&Paykel Full Face Mask F&P Vitera (new)  mask was used for the titration. - Effort should be made to optimize nasal and oropharyngeal patency. - Avoid alcohol, sedatives and other CNS depressants that may worsen sleep apnea and disrupt normal sleep architecture. - Sleep hygiene should be reviewed to assess factors that may improve sleep quality. - Weight management (BMI 32) and regular exercise should be initiated or continued. - Recommend a download in 30 days and sleep clinic evaluation after 4 weeks of therapy.  [Electronically signed]  11/22/2018 04:52 PM  Shelva Majestic MD, [facility], [place], American Board of Sleep Medicine   NPI: [id number] [place] PH: [phone]   FX: [phone] [place]

## 2018-12-18 ENCOUNTER — Telehealth: Payer: Self-pay | Admitting: Cardiovascular Disease

## 2018-12-18 NOTE — Telephone Encounter (Signed)
LVM for patient regarding followup appt with Dr. Claiborne Billings on 01-29-2019 at 2:40.

## 2018-12-19 ENCOUNTER — Other Ambulatory Visit: Payer: Self-pay | Admitting: Cardiovascular Disease

## 2019-01-03 ENCOUNTER — Ambulatory Visit (HOSPITAL_COMMUNITY): Payer: No Typology Code available for payment source | Attending: Cardiology

## 2019-01-03 ENCOUNTER — Other Ambulatory Visit: Payer: Self-pay

## 2019-01-03 DIAGNOSIS — I5041 Acute combined systolic (congestive) and diastolic (congestive) heart failure: Secondary | ICD-10-CM | POA: Insufficient documentation

## 2019-01-29 ENCOUNTER — Ambulatory Visit (INDEPENDENT_AMBULATORY_CARE_PROVIDER_SITE_OTHER): Payer: No Typology Code available for payment source | Admitting: Cardiovascular Disease

## 2019-01-29 ENCOUNTER — Other Ambulatory Visit: Payer: Self-pay

## 2019-01-29 ENCOUNTER — Telehealth: Payer: Self-pay | Admitting: *Deleted

## 2019-01-29 ENCOUNTER — Encounter: Payer: Self-pay | Admitting: Cardiovascular Disease

## 2019-01-29 VITALS — BP 122/74 | HR 54 | Ht 66.0 in | Wt 204.0 lb

## 2019-01-29 DIAGNOSIS — G4733 Obstructive sleep apnea (adult) (pediatric): Secondary | ICD-10-CM

## 2019-01-29 DIAGNOSIS — I5042 Chronic combined systolic (congestive) and diastolic (congestive) heart failure: Secondary | ICD-10-CM | POA: Diagnosis not present

## 2019-01-29 DIAGNOSIS — I452 Bifascicular block: Secondary | ICD-10-CM | POA: Diagnosis not present

## 2019-01-29 DIAGNOSIS — I420 Dilated cardiomyopathy: Secondary | ICD-10-CM

## 2019-01-29 MED ORDER — SACUBITRIL-VALSARTAN 97-103 MG PO TABS: 1.0000 | ORAL_TABLET | Freq: Two times a day (BID) | ORAL | 3 refills | Status: DC

## 2019-01-29 NOTE — Progress Notes (Signed)
Cardiology Office Note    Date:  01/31/2019   ID:  Abdulloh, Ullom June 12, 1955, MRN [id number]  PCP:  [facility]  Cardiologist:  Shelva Majestic, MD   No chief complaint on file.  F/U cardiology evaluation.    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  He presents for a 26-monthfollow-up cardiology evaluation.  Mr. RMazordenies any awareness of prior cardiac disease.  In late August he began to notice some increasing shortness of breath and felt he was having some respiratory difficulties.  He went to urgent care, was given a Z-Pak and was started on empiric albuterol and received a Medrol Dosepak.  He did not have definitive benefit from this treatment.  He also admitted to postnasal drainage and has been taking some Claritin-D.  At the time he states his O2 saturation was 96% and his pulse was increased.  Over the past month he has noticed progressive shortness of breath particularly more with activity and with periods of agitation.  He also has noticed a vague chest pressure.  This past weekend [place] [employer]  He was not prescribed any therapy.  Because of increasing symptoms of shortness of breath, with development of PND orthopnea and at times feeling like he has to sit up at nighttime to breathe he walked into our on December 17, 2017 office  and was worked into my schedule and seen as an add-on.  When I initially saw him, I was concerned that his symptom complex was highly suggestive of acute systolic heart failure.  At the time, I did not know any laboratory status and in particular was unaware of his renal function.  He was tachycardic with a pulse of 111 and had a summation gallop in addition to 2-3+ pitting edema to below the knees.  I initiated carvedilol at  3.125 mg twice a day and started furosemide 40 mg daily.  He underwent an chest x-ray which showed right basilar infiltrate with associated effusion.  Laboratory revealed an elevated BNP at 1653.  Creatinine was 1.07.  Hemoglobin 14.6, hematocrit 45.9.  Lipid studies revealed total cholesterol 169, HDL 31, LDL 116 and triglycerides 109.  TSH was 4.7.  I recommended he return to the office 1 week later for reassessment.  When I saw him on December 24, 2017 he was feeling significantly improved and had lost 16 pounds of fluid.  His lower extremity edema was significantly better.  He was breathing better and deniedchest pain or palpitations.  During that evaluation I initiated Entresto 24/26 mg twice a day and recommended support stockings.  I had recommended that he see our pharmacist 1 week later and if he was doing well on Entresto that Spironolactone 12.5 mg may be able to be started at that time.  He was seen by KCyril Mourningsubsequent to my office visit and during her evaluation his blood pressure was low at 96/62.  He was asymptomatic.  He was advised to reduce Lasix from 40 mg down to 20 mg and deferred initiation of spironolactone at that time.  I last saw him on January 21, 2018 time he was continuing to take the 40 mg Lasix dose since he still had fluid and he has lost additional fluid.  In total he  had lost 30 pounds since his initial evaluation from 213 down to 183 pounds.  He denied any dizziness or lightheadedness.  He denies chest pressure.  His leg edema has significantly improved although mild edema persists.  That evaluation, recommended he decrease furosemide down to 20 mg and further titrated Entresto to 49/51 mg twice a day.  His resting pulse was 85.  When I saw him in the office in January 2020 he continues to feel significantly improved.  He was walking at least a mile 4 days/week.  He denied any lightheadedness or dizziness.  He  underwent a sleep study due to my concerns for obstructive  sleep apnea on September 29, 2018 and this demonstrated at least moderate obstructive sleep apnea with an overall AHI of 15.7 and RDI of 18.7/h.  Sleep apnea was very severe with supine sleep with an AHI of 44.1/h.  He had loud snoring and oxygen nadir was 85%.  He has not yet undergone his CPAP titration and due to his significant reduction in LV function with EF of 20 to 25% prior to instituting Entresto, I had recommended an in lab titration in the event he had central apneas and BiPAP  was necessary.  I last evaluated him in a telemedicine visit on 10/22/2018.Since I last saw him, he does admit to some weight gain.  With the [place] pandemic he has not been walking as much as he had previously but he is still walking a total of approximately 8 miles per week up to 4 days/week at variable distances.  He states his blood pressure has been fairly controlled and typically is in the 120s with his resting pulse typically in the 60s.  He admitted to some weight gain since his last visit which may be contributed by less diligence and may be some increased sodium intake.  He denies chest pain.  He denies palpitations.  He denies PND orthopnea.    Mr. Strausbaugh underwent his BiPAP titration on November 17, 2018.  CPAP was titrated to 11 cm.  At 10 cm he slept for 52 minutes and REM sleep and AHI was 0 and O2 nadir 94%.  Initial trial of CPAP therapy with EPR of 10 was recommended.  Apparently he has not yet had his CPAP initiation.  This will be at expedited.  Presently, he feels he is doing well with reference to his CHF.  His shortness of breath has resolved on his current regimen consisting of carvedilol 6.25 mg twice a day, furosemide 20 mg daily, Entresto 97/103 mg twice a day in addition to spironolactone 12.5 mg daily.  Recently he had inadvertently been taking the Entresto not twice a day and I reinforced the importance of twice daily regimen.  He underwent a follow-up echo Doppler study on January 03, 2019 to  reassess potential  improvement in LV function.  Unfortunately, EF remains low at less than 20%.  There was no LVH.  He had elevated left atrial left ventricular end-diastolic pressures.  There was grade 1 diastolic dysfunction.  There was mild MR and mild TR.  He presents for evaluation.  Current Medications: Outpatient Medications Prior to Visit  Medication Sig Dispense Refill  . carvedilol (COREG) 6.25 MG tablet TAKE 1 TABLET BY MOUTH TWICE A DAY 60 tablet 1  . furosemide (LASIX) 20 MG tablet Take 1 tablet (20 mg total) by mouth daily. 90 tablet 3  . spironolactone (ALDACTONE) 25 MG tablet Take 0.5 tablets (12.5 mg total) by mouth daily. [place]  tablet 3  . sacubitril-valsartan (ENTRESTO) 97-103 MG Take 1 tablet by mouth 2 (two) times daily. 60 tablet 3  . aspirin 81 MG chewable tablet Chew 81 mg by mouth daily.    . COD LIVER OIL PO Take by mouth daily.    . Omega-3 Fatty Acids (FISH OIL OMEGA-3 PO) Take by mouth daily.     No facility-administered medications prior to visit.      Allergies:   Patient has no known allergies.   Social History   Socioeconomic History  . Marital status: Married    Spouse name: Not on file  . Number of children: 4  . Years of education: Not on file  . Highest education level: Bachelor's degree (e.g., BA, AB, BS)  Occupational History  . Occupation: optometry    Comment: self employed  Social Needs  . Financial resource strain: Not on file  . Food insecurity    Worry: Not on file    Inability: Not on file  . Transportation needs    Medical: Not on file    Non-medical: Not on file  Tobacco Use  . Smoking status: Never Smoker  . Smokeless tobacco: Never Used  Substance and Sexual Activity  . Alcohol use: Not Currently  . Drug use: Never  . Sexual activity: Not on file  Lifestyle  . Physical activity    Days per week: Not on file    Minutes per session: Not on file  . Stress: Not on file  Relationships  . Social Herbalist on  phone: Not on file    Gets together: Not on file    Attends religious service: Not on file    Active member of club or organization: Not on file    Attends meetings of clubs or organizations: Not on file    Relationship status: Not on file  Other Topics Concern  . Not on file  Social History Narrative  . Not on file    Socially he was born in [place]. He did his undergraduate degree at [employer] and ultimately attended [employer] in [place].  He is married for 37 years.  There is no tobacco or alcohol use. He has 4 children.  Family History:  The patient's family history includes COPD in his father; Diabetes type I in his child; Heart failure in his father; Liver disease in his mother; Suicidality in his paternal grandfather.   His mother died at age 64 and had liver disease from a transfusion, father died at 74 and had CHF and COPD.  He has 3 sisters ages 45, 71, and 4.  ROS General: Negative; No fevers, chills, or night sweats;  HEENT: Negative; No changes in vision or hearing, sinus congestion, difficulty swallowing Pulmonary: Negative; No cough, wheezing, shortness of breath, hemoptysis Cardiovascular: See HPI GI: Negative; No nausea, vomiting, diarrhea, or abdominal pain GU: Negative; No dysuria, hematuria, or difficulty voiding Musculoskeletal: Negative; no myalgias, joint pain, or weakness Hematologic/Oncology: Negative; no easy bruising, bleeding Endocrine: Negative; no heat/cold intolerance; no diabetes Neuro: Negative; no changes in balance, headaches Skin: Negative; No rashes or skin lesions Psychiatric: Negative; No behavioral problems, depression Sleep: Positive for OSA status post recent PSG and CPAP titration.  History of loud snoring., daytime sleepiness, hypersomnolence, bruxism, restless legs, hypnogognic hallucinations, no cataplexy Other comprehensive 14 point system review is negative.   PHYSICAL EXAM:   VS:  BP 122/74   Pulse (!) 54   Ht 5'  6" (1.676 m)   Wt 204 lb (92.5 kg)   SpO2 100%   BMI 32.93 kg/m   Repeat blood pressure by me 112/70 without orthostatic change  Wt Readings from Last 3 Encounters:  01/29/19 204 lb (92.5 kg)  11/17/18 200 lb (90.7 kg)  10/22/18 200 lb (90.7 kg)   General: Alert, oriented, no distress.  Skin: normal turgor, no rashes, warm and dry HEENT: Normocephalic, atraumatic. Pupils equal round and reactive to light; sclera anicteric; extraocular muscles intact;  Nose without nasal septal hypertrophy Mouth/Parynx benign; Mallinpatti scale 3 Neck: No JVD, no carotid bruits; normal carotid upstroke Lungs: clear to ausculatation and percussion; no wheezing or rales Chest wall: without tenderness to palpitation Heart: PMI not displaced, RRR, s1 s2 normal, 1/6 systolic murmur, no diastolic murmur, no rubs, gallops, thrills, or heaves Abdomen: soft, nontender; no hepatosplenomehaly, BS+; abdominal aorta nontender and not dilated by palpation. Back: no CVA tenderness Pulses 2+ Musculoskeletal: full range of motion, normal strength, no joint deformities Extremities: no clubbing cyanosis or edema, Homan's sign negative  Neurologic: grossly nonfocal; Cranial nerves grossly wnl Psychologic: Normal mood and affect   Studies/Labs Reviewed:   EKG:  EKG is ordered today.  ECG (independently read by me): Sinus bradycardia 54 bpm.  Right bundle branch block and left anterior hemiblock.  QTc interval 474 ms.  March 07, 2018 ECG (independently read by me): Sinus rhythm at 94,  RBBB, isolated PVC  January 21, 2018 ECG (independently read by me): Normal sinus rhythm at 85 bpm.  Right bundle branch block with repolarization changes.  Left anterior hemiblock.  No ectopy.  December 24, 2017 ECG (independently read by me): NSR at 94; RBBB with repolarization changes; QTc 512 msec  December 17, 2017 ECG (independently read by me): Sinus tachycardia at 111 bpm.  PVC.  Bifascicular block with right bundle branch  block and left anterior hemiblock.  PR interval 158 ms.  QTc interval 505 ms.    I personally reviewed the ECG that he had done at the [facility] on December 15, 2017 which shows sinus rhythm at 98 with right bundle branch block and left anterior hemiblock; PVC.   Recent Labs: BMP Latest Ref Rng & Units 09/30/2018 12/17/2017  Glucose 65 - 99 mg/dL 86 110(H)  BUN 8 - 27 mg/dL 17 30(H)  Creatinine 0.76 - 1.27 mg/dL 0.98 1.07  BUN/Creat Ratio 10 - 24 17 28(H)  Sodium 134 - 144 mmol/L 141 142  Potassium 3.5 - 5.2 mmol/L 4.2 4.4  Chloride 96 - 106 mmol/L 103 104  CO2 20 - 29 mmol/L 24 21  Calcium 8.6 - 10.2 mg/dL 8.8 9.0     Hepatic Function Latest Ref Rng & Units 09/30/2018 12/17/2017  Total Protein 6.0 - 8.5 g/dL 7.0 6.5  Albumin 3.8 - 4.8 g/dL 4.1 4.1  AST 0 - 40 IU/L 14 22  ALT 0 - 44 IU/L 12 22  Alk Phosphatase 39 - 117 IU/L 61 53  Total Bilirubin 0.0 - 1.2 mg/dL 0.3 0.7    CBC Latest Ref Rng & Units 12/17/2017  WBC 3.4 - 10.8 x10E3/uL 4.3  Hemoglobin 13.0 - 17.7 g/dL 14.6  Hematocrit 37.5 - 51.0 % 45.9  Platelets 150 - 450 x10E3/uL 172   Lab Results  Component Value Date   MCV 83 12/17/2017   Lab Results  Component Value Date   TSH 4.700 (H) 12/17/2017   No results found for: HGBA1C   BNP    Component Value Date/Time  BNP 1,653.5 (H) 12/17/2017 1026    ProBNP    Component Value Date/Time   PROBNP 760 (H) 09/30/2018 1542     Lipid Panel     Component Value Date/Time   CHOL 169 12/17/2017 1026   TRIG 109 12/17/2017 1026   HDL 31 (L) 12/17/2017 1026   CHOLHDL 5.5 (H) 12/17/2017 1026   LDLCALC 116 (H) 12/17/2017 1026     RADIOLOGY: No results found.   Additional studies/ records that were reviewed today include:  Reviewed his laboratory, chest x-ray, and echo Doppler study.  ------------------------------------------------------------------- 12/21/2017 ECHO Study Conclusions  - Left ventricle: The cavity size was severely dilated. Wall    thickness was increased in a pattern of mild LVH. Systolic   function was severely reduced. The estimated ejection fraction   was in the range of 20% to 25%. Irregular apical contour, no   definite thrombus with Definity contrast. Severe global   hypokinesis with regional variation. The study is not technically   sufficient to allow evaluation of LV diastolic function. - Aortic valve: Trileaflet; mildly thickened leaflets. There was no   regurgitation. - Mitral valve: Mildly thickened leaflets . There was moderate   regurgitation. - Left atrium: Severely dilated. - Right ventricle: The cavity size was mildly dilated. Mild   systolic dysfunction. - Right atrium: The atrium was mildly dilated. - Tricuspid valve: There was moderate regurgitation. - Pulmonary arteries: PA peak pressure: 56 mm Hg (S). - Inferior vena cava: The vessel was dilated. The respirophasic   diameter changes were blunted (< 50%), consistent with elevated   central venous pressure. - Pericardium, extracardiac: Small pericardial effusion (mostly   posterior).  Impressions:  - LVEF 20-25%, severely dilated LV with mild LVH, severe global   hypokinesis with regional variation, moderate MR, severe LAE,   mild RVE with mild RV systolic dysfunction, mild RAE, moderate   TR, RVSP 56 mmHg, dilated IVC, small mostly posterior pericardial   effusion.   ECHO 01/03/2019 1. Left ventricular ejection fraction, by visual estimation, is <20%. The left ventricle has severely decreased function. There is no left ventricular hypertrophy. 2. Elevated left atrial and left ventricular end-diastolic pressures. 3. Left ventricular diastolic parameters are consistent with Grade I diastolic dysfunction (impaired relaxation). 4. Moderately dilated left ventricular internal cavity size. 5. Global right ventricle has normal systolic function.The right ventricular size is normal. No increase in right ventricular wall thickness. 6. Left  atrial size was normal. 7. Right atrial size was normal. 8. The mitral valve is normal in structure. Mild mitral valve regurgitation. No evidence of mitral stenosis. 9. The tricuspid valve is normal in structure. Tricuspid valve regurgitation is mild. 10. The aortic valve is normal in structure. Aortic valve regurgitation is not visualized. No evidence of aortic valve sclerosis or stenosis. 11. The pulmonic valve was normal in structure. Pulmonic valve regurgitation is mild. 12. The inferior vena cava is normal in size with greater than 50% respiratory variability, suggesting right atrial pressure of 3 mmHg.  ASSESSMENT:    1. Chronic combined systolic and diastolic heart failure ([place])   2. Dilated cardiomyopathy ([place])   3. OSA (obstructive sleep apnea)   4. 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  He had developed  respiratory symptoms in late August 2019 and empirically was treated with a Z-Pak, albuterol, and Medrol Dosepak.  Subsequently, he developed  progressive symptoms of shortness of breath.  When I initially saw him, he was having PND, orthopnea, had 2-3+ pitting edema, and a summation gallop on exam.  Subsequent evaluation has confirmed my suspicion for CHF.  His echo Doppler study revealed an EF of 20 to 25% with significant LV dilation at end diastole at 68 mm in end systole at 58 mm.  There was moderate MR.  He had severe left atrial dilatation, mild right atrial dilatation, moderate TR, and moderate pulmonary hypertension with a peak PA pressure of 56.  Over the past year, he has successfully been treated with guideline directed medical therapy for his combined systolic and diastolic heart failure.  He is now on a regimen consisting of carvedilol 6.25 mg twice a day, furosemide 20 mg daily, Entresto 97/103 twice a day, and spironolactone 12.5 mg daily.   However, he states that at times he has not been taking the Entresto twice a day.  I reiterated to him the importance of the twice daily regimen.  I reviewed his most recent echo Doppler study.  Unfortunately, there does not appear to be significant improvement in LV function with EF reported at less than 20%.  I have recommended EP evaluation with Dr. Crissie Sickles for consideration of prophylactic ICD for secondary prevention of sudden cardiac arrhythmic death.  His blood pressure today is stable and he is tolerating therapy well.  Due to my concerns for obstructive sleep apnea with his underlying CHF he was ultimately referred for a sleep study and subsequently has undergone his CPAP titration.  I will expedite his CPAP set up so that this can be implemented as soon as possible.  I have reviewed recent laboratory.  He does not have any chest pain.  At present his dyspnea has resolved.  We will also need to consider an ischemic evaluation with either nuclear imaging versus coronary CTA to assess coronary anatomy.  I will see him in follow-up in several months.   Medication Adjustments/Labs and Tests Ordered: Current medicines are reviewed at length with the patient today.  Concerns regarding medicines are outlined above.  Medication changes, Labs and Tests ordered today are listed in the Patient Instructions below. Patient Instructions  Medication Instructions:  Take Entresto 97/103 mg twice a day *If you need a refill on your cardiac medications before your next appointment, please call your pharmacy*  Lab Work: None ordered  Testing/Procedures: None ordered  Follow-Up: At [facility], you and your health needs are our priority.  As part of our continuing mission to provide you with exceptional heart care, we have created designated Provider Care Teams.  These Care Teams include your primary Cardiologist (physician) and Advanced Practice Providers (APPs -  Physician Assistants and Nurse  Practitioners) who all work together to provide you with the care you need, when you need it.  Your next appointment:  Thursday 05/01/19 at 1:40 pm   The format for your next appointment:  Office    Provider:  [facility]     EP Appointment  With Dr.Taylor  Friday 02/07/19 at 11:00 am  [address]   [address]     Signed, Shelva Majestic, MD  01/31/2019 12:44 PM    [facility] [address] [place], [place], [place]  [address] Phone: [phone]

## 2019-01-29 NOTE — Patient Instructions (Addendum)
Medication Instructions:  Take Entresto 97/103 mg twice a day *If you need a refill on your cardiac medications before your next appointment, please call your pharmacy*  Lab Work: None ordered  Testing/Procedures: None ordered  Follow-Up: At [facility], you and your health needs are our priority.  As part of our continuing mission to provide you with exceptional heart care, we have created designated Provider Care Teams.  These Care Teams include your primary Cardiologist (physician) and Advanced Practice Providers (APPs -  Physician Assistants and Nurse Practitioners) who all work together to provide you with the care you need, when you need it.  Your next appointment:  Thursday 05/01/19 at 1:40 pm   The format for your next appointment:  Office    Provider:  [facility]     EP Appointment  With Dr.Taylor  Friday 02/07/19 at 11:00 am  [address]   3rd Floor

## 2019-01-29 NOTE — Telephone Encounter (Signed)
Order for CPAP faxed to [place]. (Titration was read back in September, however in error  was not sent to me.)

## 2019-01-31 ENCOUNTER — Encounter: Payer: Self-pay | Admitting: Cardiovascular Disease

## 2019-01-31 ENCOUNTER — Telehealth: Payer: Self-pay | Admitting: *Deleted

## 2019-01-31 DIAGNOSIS — I5042 Chronic combined systolic (congestive) and diastolic (congestive) heart failure: Secondary | ICD-10-CM

## 2019-01-31 DIAGNOSIS — R0602 Shortness of breath: Secondary | ICD-10-CM

## 2019-01-31 DIAGNOSIS — Z01818 Encounter for other preprocedural examination: Secondary | ICD-10-CM

## 2019-01-31 DIAGNOSIS — R000 Tachycardia, unspecified: Secondary | ICD-10-CM

## 2019-01-31 DIAGNOSIS — I420 Dilated cardiomyopathy: Secondary | ICD-10-CM

## 2019-01-31 NOTE — Telephone Encounter (Signed)
Per Dr Claiborne Billings, he reviewed notes from recent office visit --  Would like for patient to a ischemic work up   Requesting for patient to have coronary CHEST CT-ANGIOGRAM   LEFT MESSAGE for PATIENT TO CALL BACK AND DISCUSS  PER Dr Claiborne Billings ,( Take 6.25 mg coreg the morning of CCTA, DO NOT GIVE METOPROLOL Tartrate)

## 2019-02-04 NOTE — Telephone Encounter (Signed)
LEFT ANOTHER MESSAGE ON PATIENT'S  VOICE MAIL TO CALL BACK  TO  DISCUSS A AND SCHEDULE  TEST  PER DR kELLY

## 2019-02-05 NOTE — Telephone Encounter (Signed)
Spoke to patient.. informed patient of Dr  Claiborne Billings  Order for a CCTA.  instruction given for  CCTA. Patient verbalized understanding. He is aware will mail mail instruction to him.  patient  Request  Test be schedule in Jan 2021 after the second week.  Prior to appointment with dr Lovena Le.  Instruction and labslip mailed. Message sent to  [place].

## 2019-02-05 NOTE — Telephone Encounter (Signed)
Left message on cell and work  Voicemail to call back .

## 2019-02-07 ENCOUNTER — Institutional Professional Consult (permissible substitution): Payer: No Typology Code available for payment source | Admitting: Internal Medicine

## 2019-02-14 ENCOUNTER — Other Ambulatory Visit: Payer: Self-pay | Admitting: Cardiovascular Disease

## 2019-03-06 ENCOUNTER — Telehealth: Payer: Self-pay | Admitting: *Deleted

## 2019-03-06 NOTE — Telephone Encounter (Signed)
Called patient to inform him that [facility] has attempted many times to contact him about the finances and set up of his CPAP machine. He has not returned any of their calls. He states that he can't do it today or tomorrow. It will have to be next week. I informed him, right now they just want him to call them back to discuss how to help him  get set up since he is considered "self pay". Not come for a appointment. I offered him the phone # to call and he said he already has it. They called and left him a message about 30 mins prior to my call. He assured me he will return a call to them.

## 2019-03-11 ENCOUNTER — Other Ambulatory Visit: Payer: Self-pay | Admitting: Cardiovascular Disease

## 2019-03-25 ENCOUNTER — Institutional Professional Consult (permissible substitution): Payer: No Typology Code available for payment source | Admitting: Internal Medicine

## 2019-04-08 ENCOUNTER — Other Ambulatory Visit: Payer: Self-pay | Admitting: Cardiovascular Disease

## 2019-04-08 NOTE — Telephone Encounter (Signed)
Rx has been sent to the pharmacy electronically. ° °

## 2019-05-01 ENCOUNTER — Ambulatory Visit: Payer: No Typology Code available for payment source | Admitting: Cardiovascular Disease

## 2019-05-05 ENCOUNTER — Ambulatory Visit: Payer: No Typology Code available for payment source | Admitting: Cardiology

## 2019-05-11 ENCOUNTER — Other Ambulatory Visit: Payer: Self-pay | Admitting: Cardiovascular Disease

## 2019-06-15 ENCOUNTER — Other Ambulatory Visit: Payer: Self-pay | Admitting: Cardiovascular Disease

## 2019-07-30 ENCOUNTER — Encounter: Payer: Self-pay | Admitting: Cardiovascular Disease

## 2019-07-30 ENCOUNTER — Ambulatory Visit (INDEPENDENT_AMBULATORY_CARE_PROVIDER_SITE_OTHER): Payer: No Typology Code available for payment source | Admitting: Cardiovascular Disease

## 2019-07-30 ENCOUNTER — Other Ambulatory Visit: Payer: Self-pay

## 2019-07-30 VITALS — BP 112/84 | HR 75 | Ht 66.0 in | Wt 200.2 lb

## 2019-07-30 DIAGNOSIS — I420 Dilated cardiomyopathy: Secondary | ICD-10-CM

## 2019-07-30 DIAGNOSIS — I4510 Unspecified right bundle-branch block: Secondary | ICD-10-CM | POA: Diagnosis not present

## 2019-07-30 DIAGNOSIS — I5042 Chronic combined systolic (congestive) and diastolic (congestive) heart failure: Secondary | ICD-10-CM

## 2019-07-30 DIAGNOSIS — G4733 Obstructive sleep apnea (adult) (pediatric): Secondary | ICD-10-CM

## 2019-07-30 MED ORDER — SACUBITRIL-VALSARTAN 97-103 MG PO TABS: 1.0000 | ORAL_TABLET | Freq: Two times a day (BID) | ORAL | 3 refills | Status: DC

## 2019-07-30 NOTE — Progress Notes (Signed)
Cardiology Office Note    Date:  08/02/2019   ID:  Melvin Marshall, Melvin Marshall 1955-10-27, MRN [id number]  PCP:  [facility]  Cardiologist:  Shelva Majestic, MD   No chief complaint on file.  7 month F/U cardiology evaluation.    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  I last saw him in December 2020.  He presents for a 7 month follow-up cardiology evaluation.  When I initially saw Melvin Marshall, he denied any awareness of known cardiac disease. In late August 2019 he began to notice some increasing shortness of breath and felt he was having some respiratory difficulties.  He went to Urgent Care, was given a Z-Pak and was started on empiric albuterol and received a Medrol Dosepak.  He did not have definitive benefit from this treatment.  He also admitted to postnasal drainage and was taking some Claritin-D.  At the time he states his O2 saturation was 96% and his pulse was increased.  Over the month prior to my evaluation he noticed progressive shortness of breath particularly more with activity and with periods of agitation.  He also has noticed a vague chest pressure.  This past weekend while at work in the [employer] he was checked out apparently at the [employer].  He was not prescribed any therapy.  Because of increasing symptoms of shortness of breath, with development of PND orthopnea and at times feeling like he has to sit up at nighttime to breathe he walked into our on December 17, 2017 office  and was worked into my schedule and seen as an add-on.  When I initially saw him, I was concerned that his symptom complex was highly suggestive of acute systolic heart failure.  At the time, I did not know any laboratory status and in particular was unaware of his renal function.  He was tachycardic with a pulse of 111 and had a summation gallop in  addition to 2-3+ pitting edema to below the knees.  I initiated carvedilol at 3.125 mg twice a day and started furosemide 40 mg daily.  He underwent an chest x-ray which showed right basilar infiltrate with associated effusion.  Laboratory revealed an elevated BNP at 1653.  Creatinine was 1.07.  Hemoglobin 14.6, hematocrit 45.9.  Lipid studies revealed total cholesterol 169, HDL 31, LDL 116 and triglycerides 109.  TSH was 4.7.  I recommended he return to the office 1 week later for reassessment.  When I saw him on December 24, 2017 he was feeling significantly improved and had lost 16 pounds of fluid.  His lower extremity edema was significantly better.  He was breathing better and deniedchest pain or palpitations.  During that evaluation I initiated Entresto 24/26 mg twice a day and recommended support stockings.  I had recommended that he see our pharmacist 1 week later and if he was doing well on Entresto that Spironolactone 12.5 mg may be able to be started at that time.  He was seen by Cyril Mourning subsequent to my office visit and during her evaluation his blood pressure was low at 96/62.  He was asymptomatic.  He was advised to reduce Lasix from 40 mg down to 20 mg and deferred initiation of spironolactone at that time.  I saw him on January 21, 2018 time he was continuing to take the 40 mg Lasix  dose since he still had fluid and he has lost additional fluid.  In total he had lost 30 pounds since his initial evaluation from 213 down to 183 pounds.  He denied any dizziness or lightheadedness.  He denies chest pressure.  His leg edema has significantly improved although mild edema persists.  That evaluation, recommended he decrease furosemide down to 20 mg and further titrated Entresto to 49/51 mg twice a day.  His resting pulse was 85.  When I saw him in January 2020 he continued to feel significantly improved.  He was walking at least a mile 4 days/week.  He denied any lightheadedness or dizziness.  Due  to my concerns for obstructive sleep apnea he underwent a sleep study on September 29, 2018 which demonstrated at least moderate obstructive sleep apnea with an overall AHI of 15.7 and RDI of 18.7/h.  Sleep apnea was very severe with supine sleep with an AHI of 44.1/h.  He had loud snoring and oxygen nadir was 85%.  He has not yet undergone his CPAP titration and due to his significant reduction in LV function with EF of 20 to 25% prior to instituting Entresto, I had recommended an in lab titration in the event he had central apneas and BiPAP  was necessary.  I evaluated him in a telemedicine visit on 10/22/2018.Since I last saw him, he does admit to some weight gain.  With the [place] pandemic he has not been walking as much as he had previously but he is still walking a total of approximately 8 miles per week up to 4 days/week at variable distances.  He states his blood pressure has been fairly controlled and typically is in the 120s with his resting pulse typically in the 60s.  He admitted to some weight gain since his last visit which may be contributed by less diligence and may be some increased sodium intake.  He denied chest pain, palpitations,PND or orthopnea.    Melvin Marshall underwent his BiPAP titration on November 17, 2018.  CPAP was titrated to 11 cm.  At 10 cm he slept for 52 minutes and REM sleep and AHI was 0 and O2 nadir 94%.  Initial trial of CPAP therapy with EPR of 10 was recommended.  Apparently he has not yet had his CPAP initiation.  This will be at expedited.  Presently, he feels he is doing well with reference to his CHF.  His shortness of breath has resolved on his current regimen consisting of carvedilol 6.25 mg twice a day, furosemide 20 mg daily, Entresto 97/103 mg twice a day in addition to spironolactone 12.5 mg daily.  Recently he had inadvertently been taking the Entresto not twice a day and I reinforced the importance of twice daily regimen.  He underwent a follow-up echo Doppler study  on January 03, 2019 to reassess potential  improvement in LV function.  Unfortunately, EF remained low at less than 20%.  There was no LVH.  He had elevated left atrial left ventricular end-diastolic pressures.  There was grade 1 diastolic dysfunction.  There was mild MR and mild TR.  He presents for evaluation.   Last saw him for evaluation of January 29, 2019 I recommended a repeat evaluation with Dr. Lovena Le for consideration of prophylactic ICD for secondary prevention of sudden cardiac arrhythmic death.  He had not yet received a CPAP and recommended expedition office/set up.  Apparently, Mr. Paddock never pursued H evaluation due to financial issues.  He states that he had  a $10,000 deductible and had 2 children with college and had recently had a pacer placed.  As result he has not yet been on CPAP nor has he seen EP for consideration of ICD therapy.  Over the last several months, he believes he is feeling well.  He continues to take carvedilol 6.25 mg twice a day, Entresto 97/103 mg twice a day, spironolactone 12.5 mg daily and Lasix 20 mg.  He is unaware of palpitations, presyncope or syncope.  He denies chest pain.   Current Medications: Outpatient Medications Prior to Visit  Medication Sig Dispense Refill  . aspirin EC 81 MG tablet Take 81 mg by mouth daily. Weekdays only    . carvedilol (COREG) 6.25 MG tablet TAKE 1 TABLET BY MOUTH TWICE A DAY 60 tablet 7  . COD LIVER OIL PO Take by mouth.    . furosemide (LASIX) 20 MG tablet Take 1 tablet (20 mg total) by mouth daily. 90 tablet 3  . spironolactone (ALDACTONE) 25 MG tablet TAKE 1/2 TABLET BY MOUTH EVERY DAY 45 tablet 2  . sacubitril-valsartan (ENTRESTO) 97-103 MG Take 1 tablet by mouth 2 (two) times daily. 60 tablet 3  . furosemide (LASIX) 40 MG tablet TAKE 1 TABLET BY MOUTH EVERY DAY (Patient not taking: Reported on 07/30/2019) 90 tablet 3   No facility-administered medications prior to visit.     Allergies:   Patient has no known  allergies.   Social History   Socioeconomic History  . Marital status: Married    Spouse name: Not on file  . Number of children: 4  . Years of education: Not on file  . Highest education level: Bachelor's degree (e.g., BA, AB, BS)  Occupational History  . Occupation: optometry    Comment: self employed  Tobacco Use  . Smoking status: Never Smoker  . Smokeless tobacco: Never Used  Substance and Sexual Activity  . Alcohol use: Not Currently  . Drug use: Never  . Sexual activity: Not on file  Other Topics Concern  . Not on file  Social History Narrative  . Not on file   Social Determinants of Health   Financial Resource Strain:   . Difficulty of Paying Living Expenses:   Food Insecurity:   . Worried About Charity fundraiser in the Last Year:   . Arboriculturist in the Last Year:   Transportation Needs:   . Film/video editor (Medical):   [name] Lack of Transportation (Non-Medical):   Physical Activity:   . Days of Exercise per Week:   . Minutes of Exercise per Session:   Stress:   . Feeling of Stress :   Social Connections:   . Frequency of Communication with Friends and Family:   . Frequency of Social Gatherings with Friends and Family:   . Attends Religious Services:   . Active Member of Clubs or Organizations:   . Attends Archivist Meetings:   [name] Marital Status:     Socially he was born in [place]. He did his undergraduate degree at [employer] and ultimately attended [employer] in [place].  He is married for 37 years.  There is no tobacco or alcohol use. He has 4 children.  Family History:  The patient's family history includes COPD in his father; Diabetes type I in his child; Heart failure in his father; Liver disease in his mother; Suicidality in his paternal grandfather.   His mother died at age 88 and had liver disease  from a transfusion, father died at 41 and had CHF and COPD.  He has 3 sisters ages 30, 65, and 64.  ROS General:  Negative; No fevers, chills, or night sweats;  HEENT: Negative; No changes in vision or hearing, sinus congestion, difficulty swallowing Pulmonary: Negative; No cough, wheezing, shortness of breath, hemoptysis Cardiovascular: See HPI GI: Negative; No nausea, vomiting, diarrhea, or abdominal pain GU: Negative; No dysuria, hematuria, or difficulty voiding Musculoskeletal: Negative; no myalgias, joint pain, or weakness Hematologic/Oncology: Negative; no easy bruising, bleeding Endocrine: Negative; no heat/cold intolerance; no diabetes Neuro: Negative; no changes in balance, headaches Skin: Negative; No rashes or skin lesions Psychiatric: Negative; No behavioral problems, depression Sleep: Positive for OSA status post  PSG and CPAP titration.  History of loud snoring., daytime sleepiness, hypersomnolence, bruxism, restless legs, hypnogognic hallucinations, no cataplexy.  He never followed up with CPAP set up with DME company. Other comprehensive 14 point system review is negative.   PHYSICAL EXAM:   VS:  BP 112/84   Pulse 75   Ht 5' 6"  (1.676 m)   Wt 200 lb 3.2 oz (90.8 kg)   SpO2 97%   BMI 32.31 kg/m   Repeat blood pressure by me 112/70 without orthostatic change  Wt Readings from Last 3 Encounters:  07/30/19 200 lb 3.2 oz (90.8 kg)  01/29/19 204 lb (92.5 kg)  11/17/18 200 lb (90.7 kg)   General: Alert, oriented, no distress.  Skin: normal turgor, no rashes, warm and dry HEENT: Normocephalic, atraumatic. Pupils equal round and reactive to light; sclera anicteric; extraocular muscles intact;  Nose without nasal septal hypertrophy Mouth/Parynx benign; Mallinpatti scale 3 Neck: No JVD, no carotid bruits; normal carotid upstroke Lungs: clear to ausculatation and percussion; no wheezing or rales Chest wall: without tenderness to palpitation Heart: PMI not displaced, RRR, s1 s2 normal, 1/6 systolic murmur, no diastolic murmur, no rubs, gallops, thrills, or heaves Abdomen: soft,  nontender; no hepatosplenomehaly, BS+; abdominal aorta nontender and not dilated by palpation. Back: no CVA tenderness Pulses 2+ Musculoskeletal: full range of motion, normal strength, no joint deformities Extremities: no clubbing cyanosis or edema, Homan's sign negative  Neurologic: grossly nonfocal; Cranial nerves grossly wnl Psychologic: Normal mood and affect   Studies/Labs Reviewed:   EKG:  EKG is ordered today.  ECG (independently read by me): Normal sinus rhythm at 75 bpm.  Right bundle branch block with repolarization changes.  QTc interval 491 ms.  Small anteroseptal Q waves.  January 29, 2019 ECG (independently read by me): Sinus bradycardia 54 bpm.  Right bundle branch block and left anterior hemiblock.  QTc interval 474 ms.  March 07, 2018 ECG (independently read by me): Sinus rhythm at 94,  RBBB, isolated PVC  January 21, 2018 ECG (independently read by me): Normal sinus rhythm at 85 bpm.  Right bundle branch block with repolarization changes.  Left anterior hemiblock.  No ectopy.  December 24, 2017 ECG (independently read by me): NSR at 94; RBBB with repolarization changes; QTc 512 msec  December 17, 2017 ECG (independently read by me): Sinus tachycardia at 111 bpm.  PVC.  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  PR interval 158 ms.  QTc interval 505 ms.    I personally reviewed the ECG that he had done at [facility] on December 15, 2017 which shows sinus rhythm at 98 with right bundle branch block and left anterior hemiblock; PVC.   Recent Labs: BMP Latest Ref Rng & Units 09/30/2018 12/17/2017  Glucose 65 - 99 mg/dL 86 110(H)  BUN 8 - 27 mg/dL 17 30(H)  Creatinine 0.76 - 1.27 mg/dL 0.98 1.07  BUN/Creat Ratio 10 - 24 17 28(H)  Sodium 134 - 144 mmol/L 141 142  Potassium 3.5 - 5.2 mmol/L 4.2 4.4  Chloride 96 - 106 mmol/L 103 104  CO2 20 - 29 mmol/L 24 21  Calcium 8.6 - 10.2 mg/dL 8.8 9.0     Hepatic Function Latest Ref  Rng & Units 09/30/2018 12/17/2017  Total Protein 6.0 - 8.5 g/dL 7.0 6.5  Albumin 3.8 - 4.8 g/dL 4.1 4.1  AST 0 - 40 IU/L 14 22  ALT 0 - 44 IU/L 12 22  Alk Phosphatase 39 - 117 IU/L 61 53  Total Bilirubin 0.0 - 1.2 mg/dL 0.3 0.7    CBC Latest Ref Rng & Units 12/17/2017  WBC 3.4 - 10.8 x10E3/uL 4.3  Hemoglobin 13.0 - 17.7 g/dL 14.6  Hematocrit 37.5 - 51.0 % 45.9  Platelets 150 - 450 x10E3/uL 172   Lab Results  Component Value Date   MCV 83 12/17/2017   Lab Results  Component Value Date   TSH 4.700 (H) 12/17/2017   No results found for: HGBA1C   BNP    Component Value Date/Time   BNP 1,653.5 (H) 12/17/2017 1026    ProBNP    Component Value Date/Time   PROBNP 760 (H) 09/30/2018 1542     Lipid Panel     Component Value Date/Time   CHOL 169 12/17/2017 1026   TRIG 109 12/17/2017 1026   HDL 31 (L) 12/17/2017 1026   CHOLHDL 5.5 (H) 12/17/2017 1026   LDLCALC 116 (H) 12/17/2017 1026     RADIOLOGY: No results found.   Additional studies/ records that were reviewed today include:  Reviewed his laboratory, chest x-ray, and echo Doppler study.  ------------------------------------------------------------------- 12/21/2017 ECHO Study Conclusions  - Left ventricle: The cavity size was severely dilated. Wall   thickness was increased in a pattern of mild LVH. Systolic   function was severely reduced. The estimated ejection fraction   was in the range of 20% to 25%. Irregular apical contour, no   definite thrombus with Definity contrast. Severe global   hypokinesis with regional variation. The study is not technically   sufficient to allow evaluation of LV diastolic function. - Aortic valve: Trileaflet; mildly thickened leaflets. There was no   regurgitation. - Mitral valve: Mildly thickened leaflets . There was moderate   regurgitation. - Left atrium: Severely dilated. - Right ventricle: The cavity size was mildly dilated. Mild   systolic dysfunction. - Right  atrium: The atrium was mildly dilated. - Tricuspid valve: There was moderate regurgitation. - Pulmonary arteries: PA peak pressure: 56 mm Hg (S). - Inferior vena cava: The vessel was dilated. The respirophasic   diameter changes were blunted (< 50%), consistent with elevated   central venous pressure. - Pericardium, extracardiac: Small pericardial effusion (mostly   posterior).  Impressions:  - LVEF 20-25%, severely dilated LV with mild LVH, severe global   hypokinesis with regional variation, moderate MR, severe LAE,   mild RVE with mild RV systolic dysfunction, mild RAE, moderate   TR, RVSP 56 mmHg, dilated IVC, small mostly posterior pericardial   effusion.   ECHO 01/03/2019 1. Left ventricular ejection fraction, by visual estimation, is <20%. The left ventricle has severely decreased function. There is no left ventricular hypertrophy. 2. Elevated left atrial and left ventricular end-diastolic pressures. 3. Left ventricular diastolic parameters are consistent with  Grade I diastolic dysfunction (impaired relaxation). 4. Moderately dilated left ventricular internal cavity size. 5. Global right ventricle has normal systolic function.The right ventricular size is normal. No increase in right ventricular wall thickness. 6. Left atrial size was normal. 7. Right atrial size was normal. 8. The mitral valve is normal in structure. Mild mitral valve regurgitation. No evidence of mitral stenosis. 9. The tricuspid valve is normal in structure. Tricuspid valve regurgitation is mild. 10. The aortic valve is normal in structure. Aortic valve regurgitation is not visualized. No evidence of aortic valve sclerosis or stenosis. 11. The pulmonic valve was normal in structure. Pulmonic valve regurgitation is mild. 12. The inferior vena cava is normal in size with greater than 50% respiratory variability, suggesting right atrial pressure of 3 mmHg.   CPAP TITRATION: 11/17/2018 CLINICAL  INFORMATION The patient is referred for a CPAP titration to treat sleep apnea.  Date of NPSG:  09/29/2018: AHI 15.7/h; RDI 18.7/h; supine AHI 44.1/h; O2 nadie 85%.  SLEEP STUDY TECHNIQUE As per the AASM Manual for the Scoring of Sleep and Associated Events v2.3 (April 2016) with a hypopnea requiring 4% desaturations.  The channels recorded and monitored were frontal, central and occipital EEG, electrooculogram (EOG), submentalis EMG (chin), nasal and oral airflow, thoracic and abdominal wall motion, anterior tibialis EMG, snore microphone, electrocardiogram, and pulse oximetry. Continuous positive airway pressure (CPAP) was initiated at the beginning of the study and titrated to treat sleep-disordered breathing.  MEDICATIONS     aspirin 81 MG chewable tablet             carvedilol (COREG) 6.25 MG tablet         COD LIVER OIL PO         furosemide (LASIX) 20 MG tablet (Expired)         Omega-3 Fatty Acids (FISH OIL OMEGA-3 PO)         sacubitril-valsartan (ENTRESTO) 97-103 MG         spironolactone (ALDACTONE) 25 MG tablet (Expired)      Medications self-administered by patient taken the night of the study : N/A  TECHNICIAN COMMENTS Comments added by technician: Patient had difficulty initiating sleep. F&P VITERA FULL FACE MASK MEDIUM WAS USED FOR THIS STUDY Comments added by scorer: N/A  RESPIRATORY PARAMETERS Optimal PAP Pressure (cm):  11        AHI at Optimal Pressure (/hr):            0.0 Overall Minimal O2 (%):         90.0     Supine % at Optimal Pressure (%):    0 Minimal O2 at Optimal Pressure (%): 94.0       SLEEP ARCHITECTURE The study was initiated at 10:59:11 PM and ended at 5:01:02 AM.  Sleep onset time was 4.7 minutes and the sleep efficiency was 86.5%%. The total sleep time was 313.1 minutes.  The patient spent 2.7%% of the night in stage N1 sleep, 75.9%% in stage N2 sleep, 0.0%% in stage N3 and 21.4% in REM.Stage REM latency was 92.0 minutes  Wake after  sleep onset was 44.0. Alpha intrusion was absent. Supine sleep was 16.45%.  CARDIAC DATA The 2 lead EKG demonstrated sinus rhythm. The mean heart rate was 66.0 beats per minute. Other EKG findings include: PVCs.  LEG MOVEMENT DATA The total Periodic Limb Movements of Sleep (PLMS) were 0. The PLMS index was 0.0. A PLMS index of <15 is considered normal in adults.  IMPRESSIONS - CPAP was  initiated at 5 cm and was titrated to11 cm of water. At 10 cm patient slept for 52 minutes in REM sleep; AHI 0 and O2 nadir 94%. AHI at 11 cm is 0.  - Central sleep apnea was not noted during this titration (CAI = 0.8/h). - Significant oxygen desaturations were not observed during this titration (min O2 = 90.0%). - No snoring was audible during this study. - 2-lead EKG demonstrated: PVCs - Clinically significant periodic limb movements were not noted during this study. Arousals associated with PLMs were rare.  DIAGNOSIS - Obstructive Sleep Apnea (327.23 [G47.33 ICD-10])  RECOMMENDATIONS - Recommend an initial trial of CPAP therapy with EPR at 10 cm H2O with heated humidification. A Medium size Fisher&Paykel Full Face Mask F&P Vitera (new) mask was used for the titration. - Effort should be made to optimize nasal and oropharyngeal patency. - Avoid alcohol, sedatives and other CNS depressants that may worsen sleep apnea and disrupt normal sleep architecture. - Sleep hygiene should be reviewed to assess factors that may improve sleep quality. - Weight management (BMI 32) and regular exercise should be initiated or continued. - Recommend a download in 30 days and sleep clinic evaluation after 4 weeks of therapy.  [Electronically signed] 11/22/2018 04:52 PM   ASSESSMENT:    1. Chronic combined systolic and diastolic heart failure ([place])   2. Dilated cardiomyopathy ([place])   3. OSA (obstructive sleep apnea)   4. 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  He had developed  respiratory symptoms in late August 2019 and empirically was treated with a Z-Pak, albuterol, and Medrol Dosepak.  Subsequently, he developed progressive symptoms of shortness of breath.  When I initially saw him, he was having PND, orthopnea, had 2-3+ pitting edema, and a summation gallop on exam.  Subsequent evaluation has confirmed my suspicion for CHF.  His echo Doppler study revealed an EF of 20 to 25% with significant LV dilation at end diastole at 68 mm in end systole at 58 mm.  There was moderate MR.  He had severe left atrial dilatation, mild right atrial dilatation, moderate TR, and moderate pulmonary hypertension with a peak PA pressure of 56.  He has successfully been treated with guideline directed medical therapy for his combined systolic and diastolic heart failure.  He is now on a regimen consisting of carvedilol 6.25 mg twice a day, furosemide 20 mg daily, Entresto 97/103 twice a day, and spironolactone 12.5 mg daily.  I last saw him in December 2020 he was not consistently taking Entresto twice a day and I strongly encouraged to do this.  A follow-up echo Doppler study continues to show reduced LV function.  I had recommended prophylactic ICD therapy but he never pursued this due to financial constraints.  I also referred him for a sleep study with CPAP titration due to significant sleep apnea but he never pursued CPAP set up.  Again he ultimately came in the office and discussed the adverse cardiovascular consequences with his significant sleep apnea if left untreated.  Due to cost issues, I have recommended he try buying equipment online with [email or web address].  I recommended a ResMed AirSense 10 CPAP auto unit with a pressure range of 10-16 centimeters of water with a heated coil.  He was given a full facemask at a CPAP titration and he can use this as his initial mask.  Presently he goes to bed  between 9 and 10 and oftentimes may wake up  between 4:30 AM 5:30 AM.  He has been walking 2 miles per day.  I again discussed an EP evaluation but at present I do not believe he will pursue this.  The past we had also discussed ischemic evaluation with possible coronary CTA versus nuclear imaging but this has not been pursued.  His ECG is stable with sinus rhythm with right bundle branch block and probable left anterior hemiblock at 75 bpm.  I will see him in 3 months for reevaluation.   Medication Adjustments/Labs and Tests Ordered: Current medicines are reviewed at length with the patient today.  Concerns regarding medicines are outlined above.  Medication changes, Labs and Tests ordered today are listed in the Patient Instructions below. Patient Instructions  Medication Instructions:  CONTINUE WITH CURRENT MEDICATIONS. NO CHANGES.  *If you need a refill on your cardiac medications before your next appointment, please call your pharmacy*   If you have labs (blood work) drawn today and your tests are completely normal, you will receive your results only by: [name] MyChart Message (if you have MyChart) OR . A paper copy in the mail If you have any lab test that is abnormal or we need to change your treatment, we will call you to review the results.    Follow-Up: At [facility], you and your health needs are our priority.  As part of our continuing mission to provide you with exceptional heart care, we have created designated Provider Care Teams.  These Care Teams include your primary Cardiologist (physician) and Advanced Practice Providers (APPs -  Physician Assistants and Nurse Practitioners) who all work together to provide you with the care you need, when you need it.  We recommend signing up for the patient portal called "MyChart".  Sign up information is provided on this After Visit Summary.  MyChart is used to connect with patients for Virtual Visits (Telemedicine).  Patients are able to view lab/test results, encounter notes,  upcoming appointments, etc.  Non-urgent messages can be sent to your provider as well.   To learn more about what you can do with MyChart, go to [email or web address].    Your next appointment:   3 month(s)  The format for your next appointment:   In Person  Provider:   Shelva Majestic, MD   Other Instructions  [email or web address]  [email or web address]  - RESMED AIRSENSE 10 AUTO  [email or web address]  - SUPPLIES  [email or web address]  -MASK IF NEEDED    Signed, Shelva Majestic, MD  08/02/2019 2:38 PM    [facility] [address] [place], [place], [place]  [address] Phone: [phone]

## 2019-07-30 NOTE — Patient Instructions (Addendum)
Medication Instructions:  CONTINUE WITH CURRENT MEDICATIONS. NO CHANGES.  *If you need a refill on your cardiac medications before your next appointment, please call your pharmacy*   If you have labs (blood work) drawn today and your tests are completely normal, you will receive your results only by: [name] MyChart Message (if you have MyChart) OR . A paper copy in the mail If you have any lab test that is abnormal or we need to change your treatment, we will call you to review the results.    Follow-Up: At [facility], you and your health needs are our priority.  As part of our continuing mission to provide you with exceptional heart care, we have created designated Provider Care Teams.  These Care Teams include your primary Cardiologist (physician) and Advanced Practice Providers (APPs -  Physician Assistants and Nurse Practitioners) who all work together to provide you with the care you need, when you need it.  We recommend signing up for the patient portal called "MyChart".  Sign up information is provided on this After Visit Summary.  MyChart is used to connect with patients for Virtual Visits (Telemedicine).  Patients are able to view lab/test results, encounter notes, upcoming appointments, etc.  Non-urgent messages can be sent to your provider as well.   To learn more about what you can do with MyChart, go to [email or web address].    Your next appointment:   3 month(s)  The format for your next appointment:   In Person  Provider:   Shelva Majestic, MD   Other Instructions  [email or web address]  [email or web address]  - RESMED AIRSENSE 10 AUTO  [email or web address]  - SUPPLIES  [email or web address]  -MASK IF NEEDED

## 2019-08-02 ENCOUNTER — Encounter: Payer: Self-pay | Admitting: Cardiovascular Disease

## 2019-11-18 ENCOUNTER — Other Ambulatory Visit: Payer: Self-pay

## 2019-11-18 MED ORDER — SACUBITRIL-VALSARTAN 97-103 MG PO TABS: 1.0000 | ORAL_TABLET | Freq: Two times a day (BID) | ORAL | 8 refills | Status: DC

## 2019-11-21 ENCOUNTER — Ambulatory Visit: Payer: No Typology Code available for payment source | Admitting: Cardiovascular Disease

## 2019-11-21 ENCOUNTER — Other Ambulatory Visit: Payer: Self-pay | Admitting: Cardiovascular Disease

## 2019-12-09 ENCOUNTER — Ambulatory Visit: Payer: No Typology Code available for payment source | Admitting: Cardiovascular Disease

## 2020-01-01 ENCOUNTER — Ambulatory Visit (INDEPENDENT_AMBULATORY_CARE_PROVIDER_SITE_OTHER): Payer: No Typology Code available for payment source | Admitting: Cardiovascular Disease

## 2020-01-01 ENCOUNTER — Other Ambulatory Visit: Payer: Self-pay

## 2020-01-01 ENCOUNTER — Encounter: Payer: Self-pay | Admitting: Cardiovascular Disease

## 2020-01-01 VITALS — BP 101/62 | HR 58 | Ht 66.0 in | Wt 186.2 lb

## 2020-01-01 DIAGNOSIS — I420 Dilated cardiomyopathy: Secondary | ICD-10-CM

## 2020-01-01 DIAGNOSIS — I452 Bifascicular block: Secondary | ICD-10-CM

## 2020-01-01 DIAGNOSIS — I5043 Acute on chronic combined systolic (congestive) and diastolic (congestive) heart failure: Secondary | ICD-10-CM

## 2020-01-01 DIAGNOSIS — I4510 Unspecified right bundle-branch block: Secondary | ICD-10-CM | POA: Diagnosis not present

## 2020-01-01 DIAGNOSIS — G4733 Obstructive sleep apnea (adult) (pediatric): Secondary | ICD-10-CM

## 2020-01-01 DIAGNOSIS — I5042 Chronic combined systolic (congestive) and diastolic (congestive) heart failure: Secondary | ICD-10-CM | POA: Diagnosis not present

## 2020-01-01 MED ORDER — SPIRONOLACTONE 25 MG PO TABS
12.5000 mg | ORAL_TABLET | ORAL | 2 refills | Status: DC
Start: 2020-01-01 — End: 2021-02-14

## 2020-01-01 NOTE — Patient Instructions (Addendum)
Medication Instructions:  PLEASE TAKE SPIRONOLACTONE (12.5mg ) EVERY OTHER DAY  *If you need a refill on your cardiac medications before your next appointment, please call your pharmacy*  Testing/Procedures: Your physician has requested that you have an echocardiogram. Echocardiography is a painless test that uses sound waves to create images of your heart. It provides your doctor with information about the size and shape of your heart and how well your heart's chambers and valves are working. You may receive an ultrasound enhancing agent through an IV if needed to better visualize your heart during the echo.This procedure takes approximately one hour. There are no restrictions for this procedure. This will take place at the [address], [address].   Follow-Up: At [facility], you and your health needs are our priority.  As part of our continuing mission to provide you with exceptional heart care, we have created designated Provider Care Teams.  These Care Teams include your primary Cardiologist (physician) and Advanced Practice Providers (APPs -  Physician Assistants and Nurse Practitioners) who all work together to provide you with the care you need, when you need it.  Your next appointment:   3 month(s)  The format for your next appointment:   In Person  Provider:   Shelva Majestic, MD

## 2020-01-01 NOTE — Progress Notes (Signed)
Cardiology Office Note    Date:  01/02/2020   ID:  Melvin, Marshall 04/16/1955, MRN [id number]  PCP:  [facility]  Cardiologist:  Shelva Majestic, MD   No chief complaint on file.  5 month F/U cardiology evaluation.    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  I last saw him in June 2021.[name]  He presents for a 5 month follow-up cardiology evaluation.  When I initially saw Melvin Marshall, he denied any awareness of known cardiac disease. In late August 2019 he began to notice some increasing shortness of breath and felt he was having some respiratory difficulties.  He went to Urgent Care, was given a Z-Pak and was started on empiric albuterol and received a Medrol Dosepak.  He did not have definitive benefit from this treatment.  He also admitted to postnasal drainage and was taking some Claritin-D.  At the time he states his O2 saturation was 96% and his pulse was increased.  Over the month prior to my evaluation he noticed progressive shortness of breath particularly more with activity and with periods of agitation.  He also has noticed a vague chest pressure.  This past weekend while at work in the [employer] he was checked out apparently at the [employer].  He was not prescribed any therapy.  Because of increasing symptoms of shortness of breath, with development of PND orthopnea and at times feeling like he has to sit up at nighttime to breathe he walked into our on December 17, 2017 office  and was worked into my schedule and seen as an add-on.  When I initially saw him, I was concerned that his symptom complex was highly suggestive of acute systolic heart failure.  At the time, I did not know any laboratory status and in particular was unaware of his renal function.  He was tachycardic with a pulse of 111 and had a summation gallop in  addition to 2-3+ pitting edema to below the knees.  I initiated carvedilol at 3.125 mg twice a day and started furosemide 40 mg daily.  He underwent an chest x-ray which showed right basilar infiltrate with associated effusion.  Laboratory revealed an elevated BNP at 1653.  Creatinine was 1.07.  Hemoglobin 14.6, hematocrit 45.9.  Lipid studies revealed total cholesterol 169, HDL 31, LDL 116 and triglycerides 109.  TSH was 4.7.  I recommended he return to the office 1 week later for reassessment.  When I saw him on December 24, 2017 he was feeling significantly improved and had lost 16 pounds of fluid.  His lower extremity edema was significantly better.  He was breathing better and deniedchest pain or palpitations.  During that evaluation I initiated Entresto 24/26 mg twice a day and recommended support stockings.  I had recommended that he see our pharmacist 1 week later and if he was doing well on Entresto that Spironolactone 12.5 mg may be able to be started at that time.  He was seen by Cyril Mourning subsequent to my office visit and during her evaluation his blood pressure was low at 96/62.  He was asymptomatic.  He was advised to reduce Lasix from 40 mg down to 20 mg and deferred initiation of spironolactone at that time.  I saw him on January 21, 2018 time he was continuing to take the 40 mg Lasix  dose since he still had fluid and he has lost additional fluid.  In total he had lost 30 pounds since his initial evaluation from 213 down to 183 pounds.  He denied any dizziness or lightheadedness.  He denies chest pressure.  His leg edema has significantly improved although mild edema persists.  That evaluation, recommended he decrease furosemide down to 20 mg and further titrated Entresto to 49/51 mg twice a day.  His resting pulse was 85.  When I saw him in January 2020 he continued to feel significantly improved.  He was walking at least a mile 4 days/week.  He denied any lightheadedness or dizziness.  Due  to my concerns for obstructive sleep apnea he underwent a sleep study on September 29, 2018 which demonstrated at least moderate obstructive sleep apnea with an overall AHI of 15.7 and RDI of 18.7/h.  Sleep apnea was very severe with supine sleep with an AHI of 44.1/h.  He had loud snoring and oxygen nadir was 85%.  He has not yet undergone his CPAP titration and due to his significant reduction in LV function with EF of 20 to 25% prior to instituting Entresto, I had recommended an in lab titration in the event he had central apneas and BiPAP  was necessary.  I evaluated him in a telemedicine visit on 10/22/2018.Since I last saw him, he does admit to some weight gain.  With the [place] pandemic he has not been walking as much as he had previously but he is still walking a total of approximately 8 miles per week up to 4 days/week at variable distances.  He states his blood pressure has been fairly controlled and typically is in the 120s with his resting pulse typically in the 60s.  He admitted to some weight gain since his last visit which may be contributed by less diligence and may be some increased sodium intake.  He denied chest pain, palpitations,PND or orthopnea.    Melvin Marshall underwent his BiPAP titration on November 17, 2018.  CPAP was titrated to 11 cm.  At 10 cm he slept for 52 minutes and REM sleep and AHI was 0 and O2 nadir 94%.  Initial trial of CPAP therapy with EPR of 10 was recommended.  Apparently he has not yet had his CPAP initiation.  This will be at expedited.  Presently, he feels he is doing well with reference to his CHF.  His shortness of breath has resolved on his current regimen consisting of carvedilol 6.25 mg twice a day, furosemide 20 mg daily, Entresto 97/103 mg twice a day in addition to spironolactone 12.5 mg daily.  Recently he had inadvertently been taking the Entresto not twice a day and I reinforced the importance of twice daily regimen.  He underwent a follow-up echo Doppler study  on January 03, 2019 to reassess potential  improvement in LV function.  Unfortunately, EF remained low at less than 20%.  There was no LVH.  He had elevated left atrial left ventricular end-diastolic pressures.  There was grade 1 diastolic dysfunction.  There was mild MR and mild TR.  He presents for evaluation.   When I saw him for evaluation of January 29, 2019 I recommended a repeat evaluation with Dr. Lovena Le for consideration of prophylactic ICD for secondary prevention of sudden cardiac arrhythmic death.  He had not yet received a CPAP and recommended [facility] office/set up.  Apparently, Mr. Earnshaw never pursued EP evaluation due to financial issues.  He states that he  had a $10,000 deductible and had 2 children with college and had recently had a pacer placed.  As result he has not yet been on CPAP nor has he seen EP for consideration of ICD therapy.  I last saw him in June 2021 at which time he felt he was doing well.  He continued to take carvedilol 6.25 mg twice a day, Entresto 97/103 mg twice a day, spironolactone 12.5 mg daily and Lasix 20 mg.  He is unaware of palpitations, presyncope or syncope.  He denied chest pain.  Since his last evaluation, he has remained stable.  Apparently he had purchased a CPAP device online but had not yet begun therapy.  He will need to bring his machine in for device settings and have it be linked to our office.  He does not have a DME company and they would not arrange his settings.  He is still working 5 days/week.  He denies chest pain or exertional dyspnea.  He states his blood pressure at home typically runs around 110/70.  He is unaware of palpitations, presyncope or syncope.  He presents for follow-up evaluation.   Current Medications: Outpatient Medications Prior to Visit  Medication Sig Dispense Refill  . carvedilol (COREG) 6.25 MG tablet TAKE 1 TABLET BY MOUTH TWICE A DAY 60 tablet 7  . COD LIVER OIL PO Take by mouth.    . diphenhydrAMINE  (BENADRYL ALLERGY) 25 mg capsule Take 25 mg by mouth at bedtime.    . furosemide (LASIX) 20 MG tablet Take 1 tablet (20 mg total) by mouth daily. 90 tablet 3  . sacubitril-valsartan (ENTRESTO) 97-103 MG Take 1 tablet by mouth 2 (two) times daily. 60 tablet 8  . spironolactone (ALDACTONE) 25 MG tablet TAKE 1/2 TABLET BY MOUTH EVERY DAY 45 tablet 3  . aspirin EC 81 MG tablet Take 81 mg by mouth daily. Weekdays only (Patient not taking: Reported on 01/01/2020)    . furosemide (LASIX) 40 MG tablet TAKE 1 TABLET BY MOUTH EVERY DAY (Patient not taking: Reported on 07/30/2019) 90 tablet 3   No facility-administered medications prior to visit.     Allergies:   Patient has no known allergies.   Social History   Socioeconomic History  . Marital status: Married    Spouse name: Not on file  . Number of children: 4  . Years of education: Not on file  . Highest education level: Bachelor's degree (e.g., BA, AB, BS)  Occupational History  . Occupation: optometry    Comment: self employed  Tobacco Use  . Smoking status: Never Smoker  . Smokeless tobacco: Never Used  Substance and Sexual Activity  . Alcohol use: Not Currently  . Drug use: Never  . Sexual activity: Not on file  Other Topics Concern  . Not on file  Social History Narrative  . Not on file   Social Determinants of Health   Financial Resource Strain:   . Difficulty of Paying Living Expenses: Not on file  Food Insecurity:   . Worried About Charity fundraiser in the Last Year: Not on file  . Ran Out of Food in the Last Year: Not on file  Transportation Needs:   . Lack of Transportation (Medical): Not on file  . Lack of Transportation (Non-Medical): Not on file  Physical Activity:   . Days of Exercise per Week: Not on file  . Minutes of Exercise per Session: Not on file  Stress:   . Feeling of Stress : Not  on file  Social Connections:   . Frequency of Communication with Friends and Family: Not on file  . Frequency of Social  Gatherings with Friends and Family: Not on file  . Attends Religious Services: Not on file  . Active Member of Clubs or Organizations: Not on file  . Attends Archivist Meetings: Not on file  . Marital Status: Not on file    Socially he was born in [place]. He did his undergraduate degree at [employer] and ultimately attended [employer] in [place].  He is married for 37 years.  There is no tobacco or alcohol use. He has 4 children.  Family History:  The patient's family history includes COPD in his father; Diabetes type I in his child; Heart failure in his father; Liver disease in his mother; Suicidality in his paternal grandfather.   His mother died at age 63 and had liver disease from a transfusion, father died at 76 and had CHF and COPD.  He has 3 sisters ages 16, 4, and 22.  ROS General: Negative; No fevers, chills, or night sweats;  HEENT: Negative; No changes in vision or hearing, sinus congestion, difficulty swallowing Pulmonary: Negative; No cough, wheezing, shortness of breath, hemoptysis Cardiovascular: See HPI GI: Negative; No nausea, vomiting, diarrhea, or abdominal pain GU: Negative; No dysuria, hematuria, or difficulty voiding Musculoskeletal: Negative; no myalgias, joint pain, or weakness Hematologic/Oncology: Negative; no easy bruising, bleeding Endocrine: Negative; no heat/cold intolerance; no diabetes Neuro: Negative; no changes in balance, headaches Skin: Negative; No rashes or skin lesions Psychiatric: Negative; No behavioral problems, depression Sleep: Positive for OSA status post  PSG and CPAP titration.  History of loud snoring., daytime sleepiness, hypersomnolence, bruxism, restless legs, hypnogognic hallucinations, no cataplexy.  He never followed up with CPAP set up with DME company. Other comprehensive 14 point system review is negative.   PHYSICAL EXAM:   VS:  BP 101/62   Pulse (!) 58   Ht $R'5\' 6"'Bn$  (1.676 m)   Wt 186 lb 3.2 oz (84.5  kg)   SpO2 99%   BMI 30.05 kg/m   Repeat blood pressure by me was 104/64 supine but dropped to 88/60 standing  Wt Readings from Last 3 Encounters:  01/01/20 186 lb 3.2 oz (84.5 kg)  07/30/19 200 lb 3.2 oz (90.8 kg)  01/29/19 204 lb (92.5 kg)   General: Alert, oriented, no distress.  Skin: normal turgor, no rashes, warm and dry HEENT: Normocephalic, atraumatic. Pupils equal round and reactive to light; sclera anicteric; extraocular muscles intact; Nose without nasal septal hypertrophy Mouth/Parynx benign; Mallinpatti scale 3 Neck: No JVD, no carotid bruits; normal carotid upstroke Lungs: clear to ausculatation and percussion; no wheezing or rales Chest wall: without tenderness to palpitation Heart: PMI not displaced, RRR, s1 s2 normal, 1/6 systolic murmur, no diastolic murmur, no rubs, gallops, thrills, or heaves Abdomen: soft, nontender; no hepatosplenomehaly, BS+; abdominal aorta nontender and not dilated by palpation. Back: no CVA tenderness Pulses 2+ Musculoskeletal: full range of motion, normal strength, no joint deformities Extremities: no clubbing cyanosis or edema, Homan's sign negative  Neurologic: grossly nonfocal; Cranial nerves grossly wnl Psychologic: Normal mood and affect   Studies/Labs Reviewed:   EKG:  EKG is ordered today.  ECG (independently read by me): Sinus bradycardia at 58; RBBB, LAHB, no ectopy  July 30, 2019 ECG (independently read by me): Normal sinus rhythm at 75 bpm.  Right bundle branch block with repolarization changes.  QTc interval 491 ms.  Small anteroseptal  Q waves.  January 29, 2019 ECG (independently read by me): Sinus bradycardia 54 bpm.  Right bundle branch block and left anterior hemiblock.  QTc interval 474 ms.  March 07, 2018 ECG (independently read by me): Sinus rhythm at 94,  RBBB, isolated PVC  January 21, 2018 ECG (independently read by me): Normal sinus rhythm at 85 bpm.  Right bundle branch block with repolarization changes.   Left anterior hemiblock.  No ectopy.  December 24, 2017 ECG (independently read by me): NSR at 94; RBBB with repolarization changes; QTc 512 msec  December 17, 2017 ECG (independently read by me): Sinus tachycardia at 111 bpm.  PVC.  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  PR interval 158 ms.  QTc interval 505 ms.    I personally reviewed the ECG that he had done at the [facility] on December 15, 2017 which shows sinus rhythm at 98 with right bundle branch block and left anterior hemiblock; PVC.   Recent Labs: BMP Latest Ref Rng & Units 09/30/2018 12/17/2017  Glucose 65 - 99 mg/dL 86 110(H)  BUN 8 - 27 mg/dL 17 30(H)  Creatinine 0.76 - 1.27 mg/dL 0.98 1.07  BUN/Creat Ratio 10 - 24 17 28(H)  Sodium 134 - 144 mmol/L 141 142  Potassium 3.5 - 5.2 mmol/L 4.2 4.4  Chloride 96 - 106 mmol/L 103 104  CO2 20 - 29 mmol/L 24 21  Calcium 8.6 - 10.2 mg/dL 8.8 9.0     Hepatic Function Latest Ref Rng & Units 09/30/2018 12/17/2017  Total Protein 6.0 - 8.5 g/dL 7.0 6.5  Albumin 3.8 - 4.8 g/dL 4.1 4.1  AST 0 - 40 IU/L 14 22  ALT 0 - 44 IU/L 12 22  Alk Phosphatase 39 - 117 IU/L 61 53  Total Bilirubin 0.0 - 1.2 mg/dL 0.3 0.7    CBC Latest Ref Rng & Units 12/17/2017  WBC 3.4 - 10.8 x10E3/uL 4.3  Hemoglobin 13.0 - 17.7 g/dL 14.6  Hematocrit 37.5 - 51.0 % 45.9  Platelets 150 - 450 x10E3/uL 172   Lab Results  Component Value Date   MCV 83 12/17/2017   Lab Results  Component Value Date   TSH 4.700 (H) 12/17/2017   No results found for: HGBA1C   BNP    Component Value Date/Time   BNP 1,653.5 (H) 12/17/2017 1026    ProBNP    Component Value Date/Time   PROBNP 760 (H) 09/30/2018 1542     Lipid Panel     Component Value Date/Time   CHOL 169 12/17/2017 1026   TRIG 109 12/17/2017 1026   HDL 31 (L) 12/17/2017 1026   CHOLHDL 5.5 (H) 12/17/2017 1026   LDLCALC 116 (H) 12/17/2017 1026     RADIOLOGY: No results found.   Additional studies/  records that were reviewed today include:  Reviewed his laboratory, chest x-ray, and echo Doppler study.  ------------------------------------------------------------------- 12/21/2017 ECHO Study Conclusions  - Left ventricle: The cavity size was severely dilated. Wall   thickness was increased in a pattern of mild LVH. Systolic   function was severely reduced. The estimated ejection fraction   was in the range of 20% to 25%. Irregular apical contour, no   definite thrombus with Definity contrast. Severe global   hypokinesis with regional variation. The study is not technically   sufficient to allow evaluation of LV diastolic function. - Aortic valve: Trileaflet; mildly thickened leaflets. There was no   regurgitation. - Mitral valve: Mildly thickened leaflets .  There was moderate   regurgitation. - Left atrium: Severely dilated. - Right ventricle: The cavity size was mildly dilated. Mild   systolic dysfunction. - Right atrium: The atrium was mildly dilated. - Tricuspid valve: There was moderate regurgitation. - Pulmonary arteries: PA peak pressure: 56 mm Hg (S). - Inferior vena cava: The vessel was dilated. The respirophasic   diameter changes were blunted (< 50%), consistent with elevated   central venous pressure. - Pericardium, extracardiac: Small pericardial effusion (mostly   posterior).  Impressions:  - LVEF 20-25%, severely dilated LV with mild LVH, severe global   hypokinesis with regional variation, moderate MR, severe LAE,   mild RVE with mild RV systolic dysfunction, mild RAE, moderate   TR, RVSP 56 mmHg, dilated IVC, small mostly posterior pericardial   effusion.   ECHO 01/03/2019 1. Left ventricular ejection fraction, by visual estimation, is <20%. The left ventricle has severely decreased function. There is no left ventricular hypertrophy. 2. Elevated left atrial and left ventricular end-diastolic pressures. 3. Left ventricular diastolic parameters are  consistent with Grade I diastolic dysfunction (impaired relaxation). 4. Moderately dilated left ventricular internal cavity size. 5. Global right ventricle has normal systolic function.The right ventricular size is normal. No increase in right ventricular wall thickness. 6. Left atrial size was normal. 7. Right atrial size was normal. 8. The mitral valve is normal in structure. Mild mitral valve regurgitation. No evidence of mitral stenosis. 9. The tricuspid valve is normal in structure. Tricuspid valve regurgitation is mild. 10. The aortic valve is normal in structure. Aortic valve regurgitation is not visualized. No evidence of aortic valve sclerosis or stenosis. 11. The pulmonic valve was normal in structure. Pulmonic valve regurgitation is mild. 12. The inferior vena cava is normal in size with greater than 50% respiratory variability, suggesting right atrial pressure of 3 mmHg.   CPAP TITRATION: 11/17/2018 CLINICAL INFORMATION The patient is referred for a CPAP titration to treat sleep apnea.  Date of NPSG:  09/29/2018: AHI 15.7/h; RDI 18.7/h; supine AHI 44.1/h; O2 nadie 85%.  SLEEP STUDY TECHNIQUE As per the AASM Manual for the Scoring of Sleep and Associated Events v2.3 (April 2016) with a hypopnea requiring 4% desaturations.  The channels recorded and monitored were frontal, central and occipital EEG, electrooculogram (EOG), submentalis EMG (chin), nasal and oral airflow, thoracic and abdominal wall motion, anterior tibialis EMG, snore microphone, electrocardiogram, and pulse oximetry. Continuous positive airway pressure (CPAP) was initiated at the beginning of the study and titrated to treat sleep-disordered breathing.  MEDICATIONS     aspirin 81 MG chewable tablet             carvedilol (COREG) 6.25 MG tablet         COD LIVER OIL PO         furosemide (LASIX) 20 MG tablet (Expired)         Omega-3 Fatty Acids (FISH OIL OMEGA-3 PO)         sacubitril-valsartan (ENTRESTO)  97-103 MG         spironolactone (ALDACTONE) 25 MG tablet (Expired)      Medications self-administered by patient taken the night of the study : N/A  TECHNICIAN COMMENTS Comments added by technician: Patient had difficulty initiating sleep. F&P VITERA FULL FACE MASK MEDIUM WAS USED FOR THIS STUDY Comments added by scorer: N/A  RESPIRATORY PARAMETERS Optimal PAP Pressure (cm):  11        AHI at Optimal Pressure (/hr):  0.0 Overall Minimal O2 (%):         90.0     Supine % at Optimal Pressure (%):    0 Minimal O2 at Optimal Pressure (%): 94.0       SLEEP ARCHITECTURE The study was initiated at 10:59:11 PM and ended at 5:01:02 AM.  Sleep onset time was 4.7 minutes and the sleep efficiency was 86.5%%. The total sleep time was 313.1 minutes.  The patient spent 2.7%% of the night in stage N1 sleep, 75.9%% in stage N2 sleep, 0.0%% in stage N3 and 21.4% in REM.Stage REM latency was 92.0 minutes  Wake after sleep onset was 44.0. Alpha intrusion was absent. Supine sleep was 16.45%.  CARDIAC DATA The 2 lead EKG demonstrated sinus rhythm. The mean heart rate was 66.0 beats per minute. Other EKG findings include: PVCs.  LEG MOVEMENT DATA The total Periodic Limb Movements of Sleep (PLMS) were 0. The PLMS index was 0.0. A PLMS index of <15 is considered normal in adults.  IMPRESSIONS - CPAP was initiated at 5 cm and was titrated to11 cm of water. At 10 cm patient slept for 52 minutes in REM sleep; AHI 0 and O2 nadir 94%. AHI at 11 cm is 0.  - Central sleep apnea was not noted during this titration (CAI = 0.8/h). - Significant oxygen desaturations were not observed during this titration (min O2 = 90.0%). - No snoring was audible during this study. - 2-lead EKG demonstrated: PVCs - Clinically significant periodic limb movements were not noted during this study. Arousals associated with PLMs were rare.  DIAGNOSIS - Obstructive Sleep Apnea (327.23 [G47.33  ICD-10])  RECOMMENDATIONS - Recommend an initial trial of CPAP therapy with EPR at 10 cm H2O with heated humidification. A Medium size Fisher&Paykel Full Face Mask F&P Vitera (new) mask was used for the titration. - Effort should be made to optimize nasal and oropharyngeal patency. - Avoid alcohol, sedatives and other CNS depressants that may worsen sleep apnea and disrupt normal sleep architecture. - Sleep hygiene should be reviewed to assess factors that may improve sleep quality. - Weight management (BMI 32) and regular exercise should be initiated or continued. - Recommend a download in 30 days and sleep clinic evaluation after 4 weeks of therapy.  [Electronically signed] 11/22/2018 04:52 PM   ASSESSMENT:    1. Chronic combined systolic and diastolic heart failure ([place])   2. Dilated cardiomyopathy ([place])   3. OSA (obstructive sleep apnea)   4. Right bundle branch block   5. 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  Subsequently, he developed progressive symptoms of shortness of breath.  When I initially saw him, he was having PND, orthopnea, had 2-3+ pitting edema, and a summation gallop on exam.  Subsequent evaluation has confirmed my suspicion for CHF.  His echo Doppler study revealed an EF of 20 to 25% with significant LV dilation at end diastole at 68 mm in end systole at 58 mm.  There was moderate MR.  He had severe left atrial dilatation, mild right atrial dilatation, moderate TR, and moderate pulmonary hypertension with a peak PA pressure of 56.  He has successfully been treated with guideline directed medical therapy for his combined systolic and diastolic heart failure.  He is now on a  regimen consisting of carvedilol 6.25 mg twice a day, furosemide 20  mg daily, Entresto 97/103 twice a day, and spironolactone 12.5 mg daily.  When I saw him in December 2020 he was not consistently taking Entresto twice a day and I strongly encouraged to do this.  A follow-up echo Doppler study continues to show reduced LV function.  I had recommended prophylactic ICD therapy but he never pursued this due to financial constraints.  I also referred him for a sleep study with CPAP titration due to significant sleep apnea but he never pursued CPAP set up.  Again he ultimately came in the office and discussed the adverse cardiovascular consequences with his significant sleep apnea if left untreated.  Due to cost issues, I  recommended he try buying equipment online with [email or web address].  I recommended a ResMed AirSense 10 CPAP auto unit. He was given a full facemask at a CPAP titration and he can use this as his initial mask.  Apparently he did recently obtain a machine but tried calling [facility] who would not set his machine for him.  He will need to bring his machine so that pressure settings can be made.  May be worthwhile to initiate with AutoPap at 8 to 16 cm of water.  Presently, he continues to be active and is walking 2 miles at least 5 to 6 days/week without chest pain or shortness of breath.  He does have orthostatic blood pressure drop today on his regimen consisting of carvedilol 6.25 mg twice a day, furosemide 20 mg daily, Entresto 97/103 twice daily in addition to spironolactone 12.5 mg daily.  With his drop of blood pressure in the standing position I have recommended he change spironolactone to 12.5 mg every other day.  Since it is been 1 year since his last evaluation of LV function I have recommended a follow-up echo Doppler study.  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  I again discussed data regarding prophylactic ICD and prevention of sudden cardiac death.  I will contact him  regarding his echo results and again have strongly recommended at least an EP consultation for additional consideration.  I will see him in 3 months for follow-up evaluation or sooner as needed.  Medication Adjustments/Labs and Tests Ordered: Current medicines are reviewed at length with the patient today.  Concerns regarding medicines are outlined above.  Medication changes, Labs and Tests ordered today are listed in the Patient Instructions below. Patient Instructions  Medication Instructions:  PLEASE TAKE SPIRONOLACTONE (12.5mg ) EVERY OTHER DAY  *If you need a refill on your cardiac medications before your next appointment, please call your pharmacy*  Testing/Procedures: Your physician has requested that you have an echocardiogram. Echocardiography is a painless test that uses sound waves to create images of your heart. It provides your doctor with information about the size and shape of your heart and how well your heart's chambers and valves are working. You may receive an ultrasound enhancing agent through an IV if needed to better visualize your heart during the echo.This procedure takes approximately one hour. There are no restrictions for this procedure. This will take place at the [address], [address].   Follow-Up: At [facility], you and your health needs are our priority.  As part of our continuing mission to provide you with exceptional heart care, we have created designated Provider Care Teams.  These Care Teams include your primary  Cardiologist (physician) and Advanced Practice Providers (APPs -  Physician Assistants and Nurse Practitioners) who all work together to provide you with the care you need, when you need it.  Your next appointment:   3 month(s)  The format for your next appointment:   In Person  Provider:   Shelva Majestic, MD     Signed, Shelva Majestic, MD  01/02/2020 4:06 PM    [facility] [address], [place],  [place], [place]  [address] Phone: [phone]

## 2020-01-02 ENCOUNTER — Encounter: Payer: Self-pay | Admitting: Cardiovascular Disease

## 2020-01-26 ENCOUNTER — Other Ambulatory Visit (HOSPITAL_COMMUNITY): Payer: No Typology Code available for payment source

## 2020-02-06 IMAGING — CR DG CHEST 2V
2 series · 2 of 2 positions shown · non-contrast
Comparison: None.

CLINICAL DATA: Shortness of breath for 2 weeks

EXAM:
CHEST - 2 VIEW

[w chest pa]
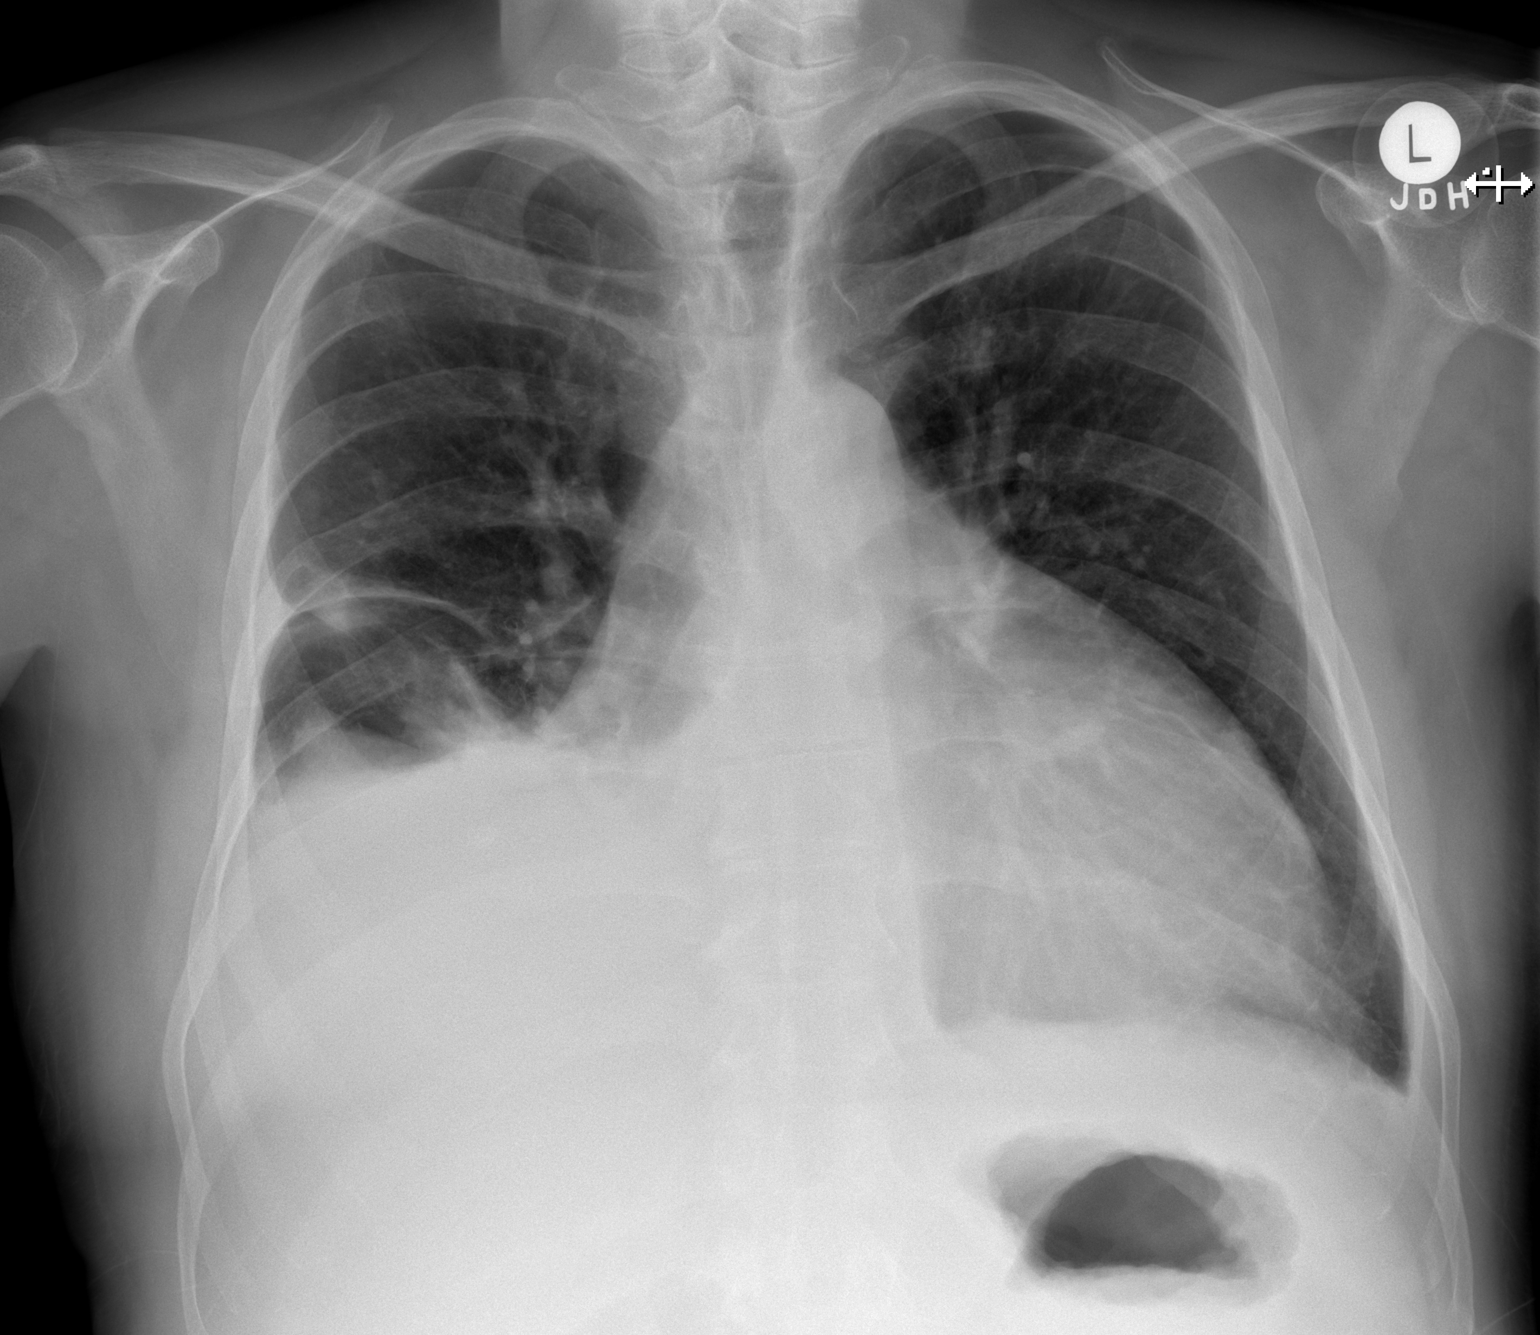

[w chest lat]
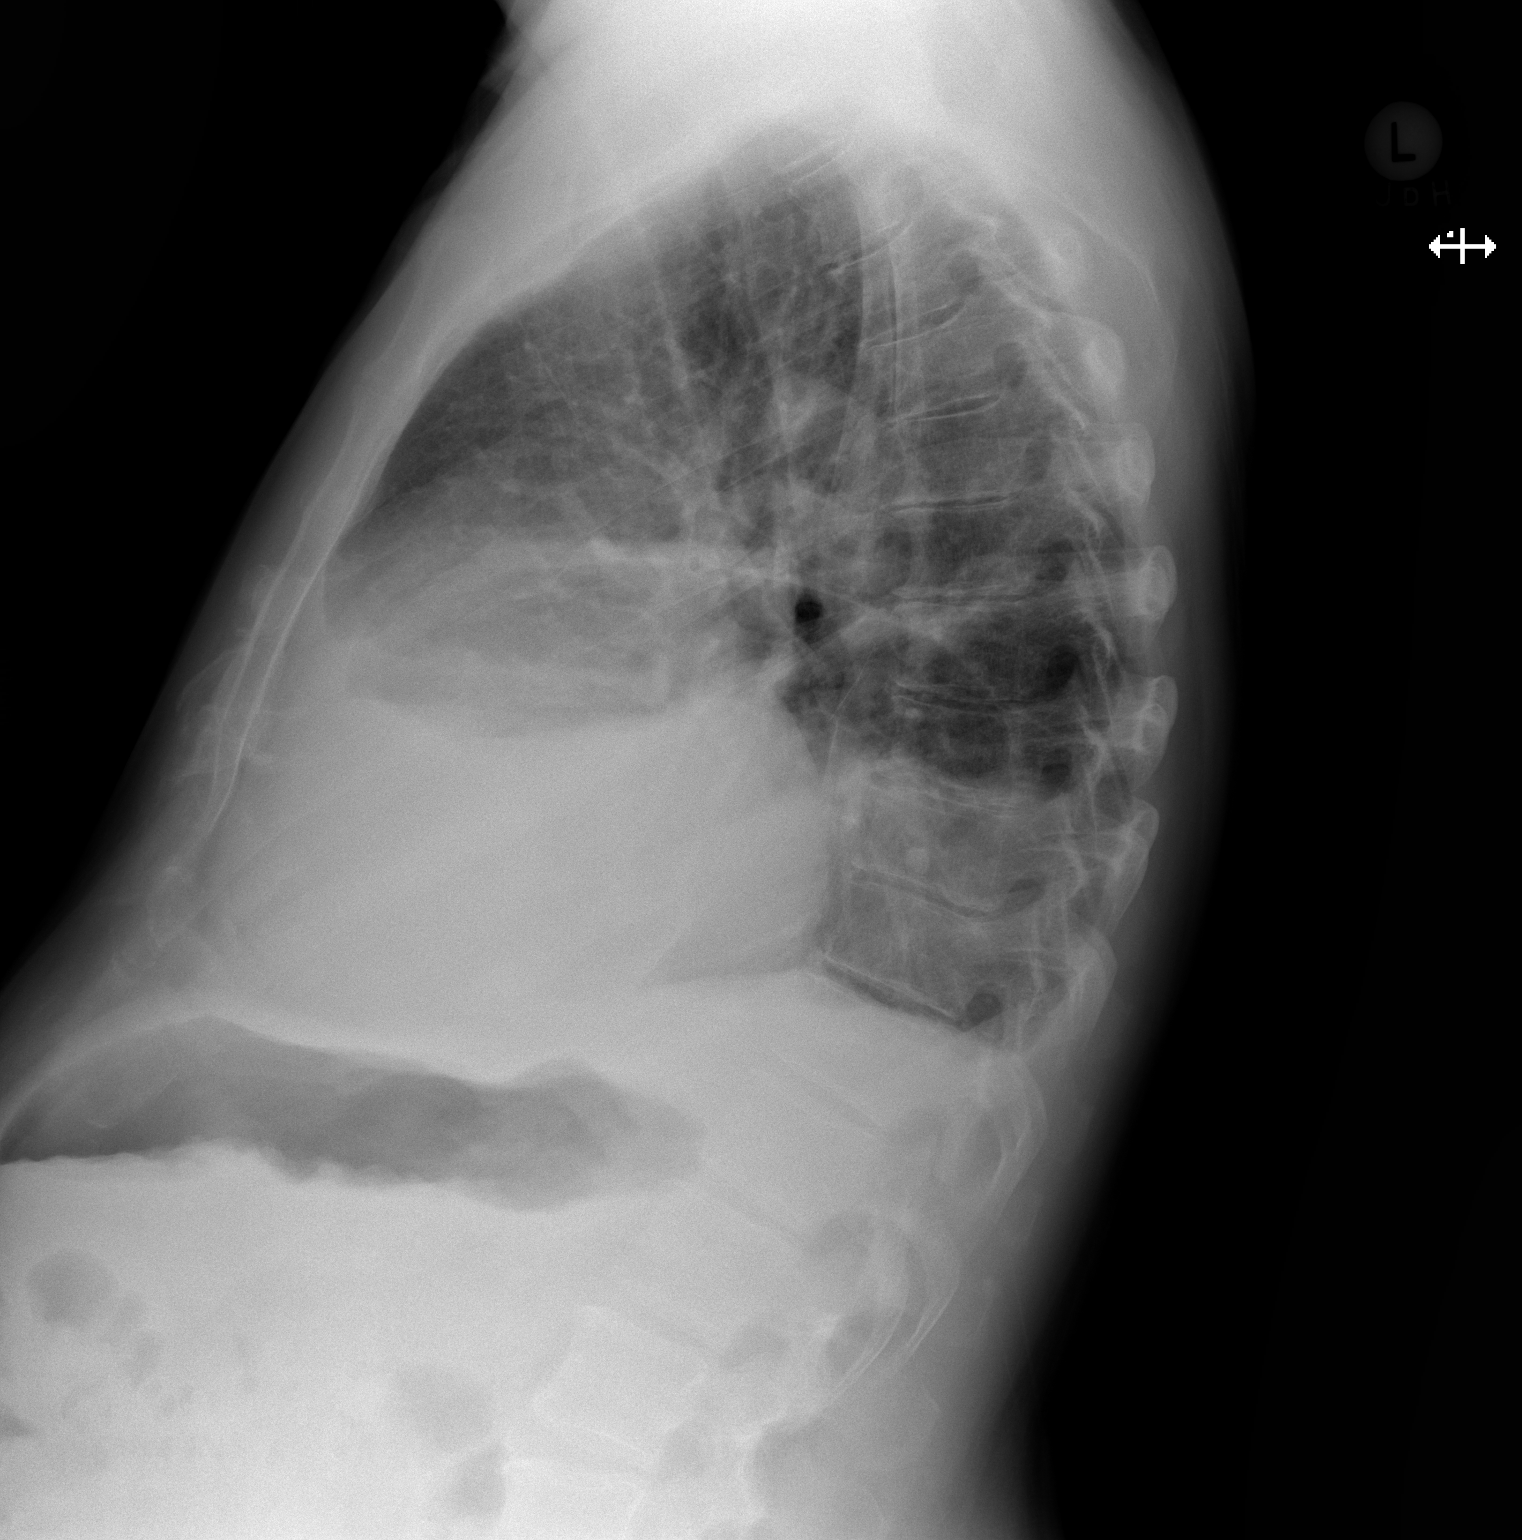

[2 of 2 positions shown; findings below may reference images not displayed]

FINDINGS: Cardiac shadow is enlarged. Right basilar infiltrate is noted with
associated small effusion. Left lung is clear. No acute bony
abnormality is noted.
IMPRESSION: Right basilar infiltrate with associated effusion. Followup PA and
lateral chest X-ray is recommended in 3-4 weeks following trial of
antibiotic therapy to ensure resolution and exclude underlying
malignancy.

## 2020-03-05 ENCOUNTER — Other Ambulatory Visit: Payer: Self-pay | Admitting: Cardiovascular Disease

## 2020-03-09 ENCOUNTER — Other Ambulatory Visit (HOSPITAL_COMMUNITY): Payer: No Typology Code available for payment source

## 2020-03-21 ENCOUNTER — Other Ambulatory Visit: Payer: Self-pay | Admitting: Cardiovascular Disease

## 2020-04-19 ENCOUNTER — Other Ambulatory Visit (HOSPITAL_COMMUNITY): Payer: No Typology Code available for payment source

## 2020-05-12 ENCOUNTER — Ambulatory Visit: Payer: No Typology Code available for payment source | Admitting: Cardiovascular Disease

## 2020-06-22 ENCOUNTER — Telehealth (HOSPITAL_COMMUNITY): Payer: Self-pay | Admitting: Cardiovascular Disease

## 2020-06-22 NOTE — Telephone Encounter (Signed)
Patient called and cancelled echocardiogram. Patient has cancelled the following dates for echo appts:  pt has cancelled 06/29/20,05/12/20,04/19/20,03/09/20,01/26/2020   Order will be removed from the [facility] and if patient calls back to reschedule we will reinstate the order. Thank you

## 2020-06-29 ENCOUNTER — Other Ambulatory Visit (HOSPITAL_COMMUNITY): Payer: No Typology Code available for payment source

## 2020-07-14 ENCOUNTER — Other Ambulatory Visit: Payer: Self-pay

## 2020-07-14 ENCOUNTER — Ambulatory Visit (INDEPENDENT_AMBULATORY_CARE_PROVIDER_SITE_OTHER): Payer: No Typology Code available for payment source | Admitting: Cardiovascular Disease

## 2020-07-14 ENCOUNTER — Encounter: Payer: Self-pay | Admitting: Cardiovascular Disease

## 2020-07-14 VITALS — BP 108/68 | HR 64 | Ht 66.0 in | Wt 166.0 lb

## 2020-07-14 DIAGNOSIS — G4733 Obstructive sleep apnea (adult) (pediatric): Secondary | ICD-10-CM | POA: Diagnosis not present

## 2020-07-14 DIAGNOSIS — I5042 Chronic combined systolic (congestive) and diastolic (congestive) heart failure: Secondary | ICD-10-CM | POA: Diagnosis not present

## 2020-07-14 DIAGNOSIS — C4442 Squamous cell carcinoma of skin of scalp and neck: Secondary | ICD-10-CM

## 2020-07-14 DIAGNOSIS — I420 Dilated cardiomyopathy: Secondary | ICD-10-CM

## 2020-07-14 DIAGNOSIS — I452 Bifascicular block: Secondary | ICD-10-CM

## 2020-07-14 MED ORDER — FUROSEMIDE 20 MG PO TABS: 20.0000 mg | ORAL_TABLET | Freq: Every day | ORAL | 3 refills | Status: DC

## 2020-07-14 MED ORDER — DAPAGLIFLOZIN PROPANEDIOL 10 MG PO TABS
10.0000 mg | ORAL_TABLET | Freq: Every day | ORAL | 3 refills | Status: DC
Start: 2020-07-14 — End: 2022-04-14

## 2020-07-14 NOTE — Patient Instructions (Signed)
Medication Instructions:  Sinclair Ship 10mg  daily.  *If you need a refill on your cardiac medications before your next appointment, please call your pharmacy*   Lab Work: None ordered.   Testing/Procedures: To be scheduled at [facility] in 2-3 weeks: Your physician has requested that you have an echocardiogram. Echocardiography is a painless test that uses sound waves to create images of your heart. It provides your doctor with information about the size and shape of your heart and how well your heart's chambers and valves are working. This procedure takes approximately one hour. There are no restrictions for this procedure.    Follow-Up: At [facility], you and your health needs are our priority.  As part of our continuing mission to provide you with exceptional heart care, we have created designated Provider Care Teams.  These Care Teams include your primary Cardiologist (physician) and Advanced Practice Providers (APPs -  Physician Assistants and Nurse Practitioners) who all work together to provide you with the care you need, when you need it.  We recommend signing up for the patient portal called "MyChart".  Sign up information is provided on this After Visit Summary.  MyChart is used to connect with patients for Virtual Visits (Telemedicine).  Patients are able to view lab/test results, encounter notes, upcoming appointments, etc.  Non-urgent messages can be sent to your provider as well.   To learn more about what you can do with MyChart, go to [email or web address].    Your next appointment:   3-4 month(s)  The format for your next appointment:   In Person  Provider:   Shelva Majestic, MD

## 2020-07-14 NOTE — Progress Notes (Signed)
Cardiology Office Note    Date:  07/20/2020   ID:  Skip, Litke [birth year], MRN [id number]  PCP:  [facility]  Cardiologist:  Shelva Majestic, MD   No chief complaint on file.  5 month F/U cardiology evaluation.    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  I last saw him in November 2021.  He presents for a 6 month follow-up cardiology evaluation.  When I initially saw Melvin Marshall, he denied any awareness of known cardiac disease. In late August 2019 he began to notice some increasing shortness of breath and felt he was having some respiratory difficulties.  He went to Urgent Care, was given a Z-Pak and was started on empiric albuterol and received a Medrol Dosepak.  He did not have definitive benefit from this treatment.  He also admitted to postnasal drainage and was taking some Claritin-D.  At the time he states his O2 saturation was 96% and his pulse was increased.  Over the month prior to my evaluation he noticed progressive shortness of breath particularly more with activity and with periods of agitation.  He also has noticed a vague chest pressure.  This past weekend while at work in the [employer] he was checked out apparently at the [employer].  He was not prescribed any therapy.  Because of increasing symptoms of shortness of breath, with development of PND orthopnea and at times feeling like he has to sit up at nighttime to breathe he walked into our on December 17, 2017 office  and was worked into my schedule and seen as an add-on.  When I initially saw him, I was concerned that his symptom complex was highly suggestive of acute systolic heart failure.  At the time, I did not know any laboratory status and in particular was unaware of his renal function.  He was tachycardic with a pulse of 111 and had a summation gallop in  addition to 2-3+ pitting edema to below the knees.  I initiated carvedilol at 3.125 mg twice a day and started furosemide 40 mg daily.  He underwent an chest x-ray which showed right basilar infiltrate with associated effusion.  Laboratory revealed an elevated BNP at 1653.  Creatinine was 1.07.  Hemoglobin 14.6, hematocrit 45.9.  Lipid studies revealed total cholesterol 169, HDL 31, LDL 116 and triglycerides 109.  TSH was 4.7.  I recommended he return to the office 1 week later for reassessment.  When I saw him on December 24, 2017 he was feeling significantly improved and had lost 16 pounds of fluid.  His lower extremity edema was significantly better.  He was breathing better and deniedchest pain or palpitations.  During that evaluation I initiated Entresto 24/26 mg twice a day and recommended support stockings.  I had recommended that he see our pharmacist 1 week later and if he was doing well on Entresto that Spironolactone 12.5 mg may be able to be started at that time.  He was seen by Cyril Mourning subsequent to my office visit and during her evaluation his blood pressure was low at 96/62.  He was asymptomatic.  He was advised to reduce Lasix from 40 mg down to 20 mg and deferred initiation of spironolactone at that time.  I saw him on January 21, 2018 time he was continuing to take the 40 mg Lasix  dose since he still had fluid and he has lost additional fluid.  In total he had lost 30 pounds since his initial evaluation from 213 down to 183 pounds.  He denied any dizziness or lightheadedness.  He denies chest pressure.  His leg edema has significantly improved although mild edema persists.  That evaluation, recommended he decrease furosemide down to 20 mg and further titrated Entresto to 49/51 mg twice a day.  His resting pulse was 85.  When I saw him in January 2020 he continued to feel significantly improved.  He was walking at least a mile 4 days/week.  He denied any lightheadedness or dizziness.  Due  to my concerns for obstructive sleep apnea he underwent a sleep study on September 29, 2018 which demonstrated at least moderate obstructive sleep apnea with an overall AHI of 15.7 and RDI of 18.7/h.  Sleep apnea was very severe with supine sleep with an AHI of 44.1/h.  He had loud snoring and oxygen nadir was 85%.  He has not yet undergone his CPAP titration and due to his significant reduction in LV function with EF of 20 to 25% prior to instituting Entresto, I had recommended an in lab titration in the event he had central apneas and BiPAP  was necessary.  I evaluated him in a telemedicine visit on 10/22/2018.Since I last saw him, he does admit to some weight gain.  With the [place] pandemic he has not been walking as much as he had previously but he is still walking a total of approximately 8 miles per week up to 4 days/week at variable distances.  He states his blood pressure has been fairly controlled and typically is in the 120s with his resting pulse typically in the 60s.  He admitted to some weight gain since his last visit which may be contributed by less diligence and may be some increased sodium intake.  He denied chest pain, palpitations,PND or orthopnea.    Melvin Marshall underwent his BiPAP titration on November 17, 2018.  CPAP was titrated to 11 cm.  At 10 cm he slept for 52 minutes and REM sleep and AHI was 0 and O2 nadir 94%.  Initial trial of CPAP therapy with EPR of 10 was recommended.  Apparently he has not yet had his CPAP initiation.  This will be at expedited.  Presently, he feels he is doing well with reference to his CHF.  His shortness of breath has resolved on his current regimen consisting of carvedilol 6.25 mg twice a day, furosemide 20 mg daily, Entresto 97/103 mg twice a day in addition to spironolactone 12.5 mg daily.  Recently he had inadvertently been taking the Entresto not twice a day and I reinforced the importance of twice daily regimen.  He underwent a follow-up echo Doppler study  on January 03, 2019 to reassess potential  improvement in LV function.  Unfortunately, EF remained low at less than 20%.  There was no LVH.  He had elevated left atrial left ventricular end-diastolic pressures.  There was grade 1 diastolic dysfunction.  There was mild MR and mild TR.  He presents for evaluation.   When I saw him for evaluation of January 29, 2019 I recommended a repeat evaluation with Dr. Lovena Le for consideration of prophylactic ICD for secondary prevention of sudden cardiac arrhythmic death.  He had not yet received a CPAP and recommended [facility] office/set up.  Apparently, Melvin Marshall never pursued EP evaluation due to financial issues.  He states that he  had a $10,000 deductible and had 2 children with college and had recently had a pacer placed.  As result he has not yet been on CPAP nor has he seen EP for consideration of ICD therapy.  I saw him in June 2021 at which time he felt he was doing well.  He continued to take carvedilol 6.25 mg twice a day, Entresto 97/103 mg twice a day, spironolactone 12.5 mg daily and Lasix 20 mg.  He is unaware of palpitations, presyncope or syncope.  He denied chest pain.  Saw him in November 2021 and since his prior evaluation he had remained cardiac stable.  Apparently he had purchased a CPAP device online but had not yet begun therapy.  He will need to bring his machine in for device settings and have it be linked to our office.  He does not have a DME company and they would not arrange his settings.  He is still working 5 days/week.  He denied chest pain or exertional dyspnea. Blood pressure at home typically runs around 110/70.  He was unaware of palpitations, presyncope or syncope.    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  He is undergoing evaluation at [facility] and has had a PET scan in addition to CT and is scheduled to see her radiology  oncologist at [facility].  He has lost 47 pounds.  He denies any chest pain.  His breathing is good without dyspnea.  He has not instituted the CPAP since the machine he bought has not been set.  It had never been linked to our office.  He presents for evaluation.   Current Medications: Outpatient Medications Prior to Visit  Medication Sig Dispense Refill  . carvedilol (COREG) 6.25 MG tablet TAKE 1 TABLET BY MOUTH TWICE A DAY 180 tablet 2  . diphenhydrAMINE (BENADRYL) 25 mg capsule Take 25 mg by mouth at bedtime.    . sacubitril-valsartan (ENTRESTO) 97-103 MG Take 1 tablet by mouth 2 (two) times daily. 60 tablet 8  . spironolactone (ALDACTONE) 25 MG tablet Take 0.5 tablets (12.5 mg total) by mouth every other day. 30 tablet 2  . COD LIVER OIL PO Take by mouth.    . furosemide (LASIX) 40 MG tablet TAKE 1 TABLET BY MOUTH EVERY DAY 90 tablet 3  . furosemide (LASIX) 20 MG tablet Take 1 tablet (20 mg total) by mouth daily. 90 tablet 3   No facility-administered medications prior to visit.     Allergies:   Patient has no known allergies.   Social History   Socioeconomic History  . Marital status: Married    Spouse name: Not on file  . Number of children: 4  . Years of education: Not on file  . Highest education level: Bachelor's degree (e.g., BA, AB, BS)  Occupational History  . Occupation: optometry    Comment: self employed  Tobacco Use  . Smoking status: Never Smoker  . Smokeless tobacco: Never Used  Substance and Sexual Activity  . Alcohol use: Not Currently  . Drug use: Never  . Sexual activity: Not on file  Other Topics Concern  . Not on file  Social History Narrative  . Not on file   Social Determinants of Health   Financial Resource Strain: Not on file  Food Insecurity: Not on file  Transportation Needs: Not on file  Physical Activity: Not on file  Stress: Not on file  Social Connections: Not on file    Socially he was born in [place]. He did his  undergraduate degree at [employer] and ultimately attended [employer] in [place].  He is married for 37 years.  There is no tobacco or alcohol use. He has 4 children.  Family History:  The patient's family history includes COPD in his father; Diabetes type I in his child; Heart failure in his father; Liver disease in his mother; Suicidality in his paternal grandfather.   His mother died at age 28 and had liver disease from a transfusion, father died at 86 and had CHF and COPD.  He has 3 sisters ages 48, 45, and 38.  ROS General: Negative; No fevers, chills, or night sweats;  HEENT: Negative; No changes in vision or hearing, sinus congestion, difficulty swallowing Pulmonary: Negative; No cough, wheezing, shortness of breath, hemoptysis Cardiovascular: See HPI GI: Negative; No nausea, vomiting, diarrhea, or abdominal pain GU: Negative; No dysuria, hematuria, or difficulty voiding Musculoskeletal: Negative; no myalgias, joint pain, or weakness Hematologic/Oncology: squamous cell carcinoma of right neck Endocrine: Negative; no heat/cold intolerance; no diabetes Neuro: Negative; no changes in balance, headaches Skin: Negative; No rashes or skin lesions Psychiatric: Negative; No behavioral problems, depression Sleep: Positive for OSA status post  PSG and CPAP titration.  History of loud snoring., daytime sleepiness, hypersomnolence, bruxism, restless legs, hypnogognic hallucinations, no cataplexy.  He never followed up with CPAP set up with DME company. Other comprehensive 14 point system review is negative.   PHYSICAL EXAM:   VS:  BP 108/68 (BP Location: Left Arm, Patient Position: Sitting, Cuff Size: Normal)   Pulse 64   Ht 5' 6" (1.676 m)   Wt 166 lb (75.3 kg)   SpO2 98%   BMI 26.79 kg/m   Repeat blood pressure by me 120/70 supine and 110/70 standing  Wt Readings from Last 3 Encounters:  07/14/20 166 lb (75.3 kg)  01/01/20 186 lb 3.2 oz (84.5 kg)  07/30/19 200 lb 3.2 oz  (90.8 kg)   General: Alert, oriented, no distress.  Skin: normal turgor, no rashes, warm and dry HEENT: Normocephalic, atraumatic. Pupils equal round and reactive to light; sclera anicteric; extraocular muscles intact; Nose without nasal septal hypertrophy Mouth/Parynx benign; Mallinpatti scale 3 Neck: enlarged firm right inframandibular lymph node Lungs: clear to ausculatation and percussion; no wheezing or rales Chest wall: without tenderness to palpitation Heart: PMI not displaced, RRR, s1 s2 normal, 1/6 systolic murmur, no diastolic murmur, no rubs, gallops, thrills, or heaves Abdomen: soft, nontender; no hepatosplenomehaly, BS+; abdominal aorta nontender and not dilated by palpation. Back: no CVA tenderness Pulses 2+ Musculoskeletal: full range of motion, normal strength, no joint deformities Extremities: no clubbing cyanosis or edema, Homan's sign negative  Neurologic: grossly nonfocal; Cranial nerves grossly wnl Psychologic: Normal mood and affect   Studies/Labs Reviewed:   EKG:  EKG is ordered today.  ECG (independently read by me): NSR at 62; RBBB, LAHB,   January 01, 2020 ECG (independently read by me): Sinus bradycardia at 58; RBBB, LAHB, no ectopy  July 30, 2019 ECG (independently read by me): Normal sinus rhythm at 75 bpm.  Right bundle branch block with repolarization changes.  QTc interval 491 ms.  Small anteroseptal Q waves.  January 29, 2019 ECG (independently read by me): Sinus bradycardia 54 bpm.  Right bundle branch block and left anterior hemiblock.  QTc interval 474 ms.  March 07, 2018 ECG (independently read by me): Sinus  rhythm at 94,  RBBB, isolated PVC  January 21, 2018 ECG (independently read by me): Normal sinus rhythm at 85 bpm.  Right bundle branch block with repolarization changes.  Left anterior hemiblock.  No ectopy.  December 24, 2017 ECG (independently read by me): NSR at 94; RBBB with repolarization changes; QTc 512 msec  December 17, 2017 ECG  (independently read by me): Sinus tachycardia at 111 bpm.  PVC.  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  PR interval 158 ms.  QTc interval 505 ms.    I personally reviewed the ECG that he had done at the [facility] on December 15, 2017 which shows sinus rhythm at 98 with right bundle branch block and left anterior hemiblock; PVC.   Recent Labs: BMP Latest Ref Rng & Units 09/30/2018 12/17/2017  Glucose 65 - 99 mg/dL 86 110(H)  BUN 8 - 27 mg/dL 17 30(H)  Creatinine 0.76 - 1.27 mg/dL 0.98 1.07  BUN/Creat Ratio 10 - 24 17 28(H)  Sodium 134 - 144 mmol/L 141 142  Potassium 3.5 - 5.2 mmol/L 4.2 4.4  Chloride 96 - 106 mmol/L 103 104  CO2 20 - 29 mmol/L 24 21  Calcium 8.6 - 10.2 mg/dL 8.8 9.0     Hepatic Function Latest Ref Rng & Units 09/30/2018 12/17/2017  Total Protein 6.0 - 8.5 g/dL 7.0 6.5  Albumin 3.8 - 4.8 g/dL 4.1 4.1  AST 0 - 40 IU/L 14 22  ALT 0 - 44 IU/L 12 22  Alk Phosphatase 39 - 117 IU/L 61 53  Total Bilirubin 0.0 - 1.2 mg/dL 0.3 0.7    CBC Latest Ref Rng & Units 12/17/2017  WBC 3.4 - 10.8 x10E3/uL 4.3  Hemoglobin 13.0 - 17.7 g/dL 14.6  Hematocrit 37.5 - 51.0 % 45.9  Platelets 150 - 450 x10E3/uL 172   Lab Results  Component Value Date   MCV 83 12/17/2017   Lab Results  Component Value Date   TSH 4.700 (H) 12/17/2017   No results found for: HGBA1C   BNP    Component Value Date/Time   BNP 1,653.5 (H) 12/17/2017 1026    ProBNP    Component Value Date/Time   PROBNP 760 (H) 09/30/2018 1542     Lipid Panel     Component Value Date/Time   CHOL 169 12/17/2017 1026   TRIG 109 12/17/2017 1026   HDL 31 (L) 12/17/2017 1026   CHOLHDL 5.5 (H) 12/17/2017 1026   LDLCALC 116 (H) 12/17/2017 1026     RADIOLOGY: No results found.   Additional studies/ records that were reviewed today include:  Reviewed his laboratory, chest x-ray, and echo Doppler  study.  ------------------------------------------------------------------- 12/21/2017 ECHO Study Conclusions  - Left ventricle: The cavity size was severely dilated. Wall   thickness was increased in a pattern of mild LVH. Systolic   function was severely reduced. The estimated ejection fraction   was in the range of 20% to 25%. Irregular apical contour, no   definite thrombus with Definity contrast. Severe global   hypokinesis with regional variation. The study is not technically   sufficient to allow evaluation of LV diastolic function. - Aortic valve: Trileaflet; mildly thickened leaflets. There was no   regurgitation. - Mitral valve: Mildly thickened leaflets . There was moderate   regurgitation. - Left atrium: Severely dilated. - Right ventricle: The cavity size was mildly dilated. Mild   systolic dysfunction. - Right atrium: The atrium was mildly dilated. - Tricuspid valve: There was  moderate regurgitation. - Pulmonary arteries: PA peak pressure: 56 mm Hg (S). - Inferior vena cava: The vessel was dilated. The respirophasic   diameter changes were blunted (< 50%), consistent with elevated   central venous pressure. - Pericardium, extracardiac: Small pericardial effusion (mostly   posterior).  Impressions:  - LVEF 20-25%, severely dilated LV with mild LVH, severe global   hypokinesis with regional variation, moderate MR, severe LAE,   mild RVE with mild RV systolic dysfunction, mild RAE, moderate   TR, RVSP 56 mmHg, dilated IVC, small mostly posterior pericardial   effusion.   ECHO 01/03/2019 1. Left ventricular ejection fraction, by visual estimation, is <20%. The left ventricle has severely decreased function. There is no left ventricular hypertrophy. 2. Elevated left atrial and left ventricular end-diastolic pressures. 3. Left ventricular diastolic parameters are consistent with Grade I diastolic dysfunction (impaired relaxation). 4. Moderately dilated left  ventricular internal cavity size. 5. Global right ventricle has normal systolic function.The right ventricular size is normal. No increase in right ventricular wall thickness. 6. Left atrial size was normal. 7. Right atrial size was normal. 8. The mitral valve is normal in structure. Mild mitral valve regurgitation. No evidence of mitral stenosis. 9. The tricuspid valve is normal in structure. Tricuspid valve regurgitation is mild. 10. The aortic valve is normal in structure. Aortic valve regurgitation is not visualized. No evidence of aortic valve sclerosis or stenosis. 11. The pulmonic valve was normal in structure. Pulmonic valve regurgitation is mild. 12. The inferior vena cava is normal in size with greater than 50% respiratory variability, suggesting right atrial pressure of 3 mmHg.   CPAP TITRATION: 11/17/2018 CLINICAL INFORMATION The patient is referred for a CPAP titration to treat sleep apnea.  Date of NPSG:  09/29/2018: AHI 15.7/h; RDI 18.7/h; supine AHI 44.1/h; O2 nadie 85%.  SLEEP STUDY TECHNIQUE As per the AASM Manual for the Scoring of Sleep and Associated Events v2.3 (April 2016) with a hypopnea requiring 4% desaturations.  The channels recorded and monitored were frontal, central and occipital EEG, electrooculogram (EOG), submentalis EMG (chin), nasal and oral airflow, thoracic and abdominal wall motion, anterior tibialis EMG, snore microphone, electrocardiogram, and pulse oximetry. Continuous positive airway pressure (CPAP) was initiated at the beginning of the study and titrated to treat sleep-disordered breathing.  MEDICATIONS     aspirin 81 MG chewable tablet             carvedilol (COREG) 6.25 MG tablet         COD LIVER OIL PO         furosemide (LASIX) 20 MG tablet (Expired)         Omega-3 Fatty Acids (FISH OIL OMEGA-3 PO)         sacubitril-valsartan (ENTRESTO) 97-103 MG         spironolactone (ALDACTONE) 25 MG tablet (Expired)      Medications  self-administered by patient taken the night of the study : N/A  TECHNICIAN COMMENTS Comments added by technician: Patient had difficulty initiating sleep. F&P VITERA FULL FACE MASK MEDIUM WAS USED FOR THIS STUDY Comments added by scorer: N/A  RESPIRATORY PARAMETERS Optimal PAP Pressure (cm):  11        AHI at Optimal Pressure (/hr):            0.0 Overall Minimal O2 (%):         90.0     Supine % at Optimal Pressure (%):    0 Minimal O2 at Optimal Pressure (%): 94.0  SLEEP ARCHITECTURE The study was initiated at 10:59:11 PM and ended at 5:01:02 AM.  Sleep onset time was 4.7 minutes and the sleep efficiency was 86.5%%. The total sleep time was 313.1 minutes.  The patient spent 2.7%% of the night in stage N1 sleep, 75.9%% in stage N2 sleep, 0.0%% in stage N3 and 21.4% in REM.Stage REM latency was 92.0 minutes  Wake after sleep onset was 44.0. Alpha intrusion was absent. Supine sleep was 16.45%.  CARDIAC DATA The 2 lead EKG demonstrated sinus rhythm. The mean heart rate was 66.0 beats per minute. Other EKG findings include: PVCs.  LEG MOVEMENT DATA The total Periodic Limb Movements of Sleep (PLMS) were 0. The PLMS index was 0.0. A PLMS index of <15 is considered normal in adults.  IMPRESSIONS - CPAP was initiated at 5 cm and was titrated to11 cm of water. At 10 cm patient slept for 52 minutes in REM sleep; AHI 0 and O2 nadir 94%. AHI at 11 cm is 0.  - Central sleep apnea was not noted during this titration (CAI = 0.8/h). - Significant oxygen desaturations were not observed during this titration (min O2 = 90.0%). - No snoring was audible during this study. - 2-lead EKG demonstrated: PVCs - Clinically significant periodic limb movements were not noted during this study. Arousals associated with PLMs were rare.  DIAGNOSIS - Obstructive Sleep Apnea (327.23 [G47.33 ICD-10])  RECOMMENDATIONS - Recommend an initial trial of CPAP therapy with EPR at 10 cm H2O with heated  humidification. A Medium size Fisher&Paykel Full Face Mask F&P Vitera (new) mask was used for the titration. - Effort should be made to optimize nasal and oropharyngeal patency. - Avoid alcohol, sedatives and other CNS depressants that may worsen sleep apnea and disrupt normal sleep architecture. - Sleep hygiene should be reviewed to assess factors that may improve sleep quality. - Weight management (BMI 32) and regular exercise should be initiated or continued. - Recommend a download in 30 days and sleep clinic evaluation after 4 weeks of therapy.  [Electronically signed] 11/22/2018 04:52 PM   ASSESSMENT:    1. Dilated cardiomyopathy ([place])   2. Chronic combined systolic and diastolic heart failure ([place])   3. OSA (obstructive sleep apnea)   4. Squamous cell carcinoma of neck   5. 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  Subsequently, he developed progressive symptoms of shortness of breath.  When I initially saw him, he was having PND, orthopnea, had 2-3+ pitting edema, and a summation gallop on exam.  Subsequent evaluation has confirmed my suspicion for CHF.  His echo Doppler study revealed an EF of 20 to 25% with significant LV dilation at end diastole at 68 mm in end systole at 58 mm.  There was moderate MR.  He had severe left atrial dilatation, mild right atrial dilatation, moderate TR, and moderate pulmonary hypertension with a peak PA pressure of 56.  He has successfully been treated with guideline directed medical therapy for his combined systolic and diastolic heart failure.  He is now on a regimen consisting of carvedilol 6.25 mg twice a day, furosemide 20 mg daily, Entresto 97/103 twice a day, and spironolactone 12.5 mg daily.  When I saw him in December  2020 he was not consistently taking Entresto twice a  day and I strongly encouraged to do this.  A follow-up echo Doppler study continues to show reduced LV function.  I had recommended prophylactic ICD therapy but he never pursued this due to financial constraints.  I also referred him for a sleep study with CPAP titration due to significant sleep apnea but he never pursued CPAP set up.  Again he ultimately came in the office and discussed the adverse cardiovascular consequences with his significant sleep apnea if left untreated.  Due to cost issues, I  recommended he try buying equipment online with [email or web address].  I recommended a ResMed AirSense 10 CPAP auto unit. He was given a full facemask at a CPAP titration and he can use this as his initial mask.  Apparently he obtained a machine but tried calling [facility] who would not set his machine for him.  I discussed with him that he will need to link his machine to our office.  We will need to obtain the serial number and I contacted Barry Brunner who will notify the patient so that the particular information can be set up in our office so that we can institute AutoPap at an initial pressure of 8 to 16 cm of water.  He has felt improved since initiating Entresto and can is now on maximum dosing at 97/103 mg twice a day, spironolactone 12.5 mg every other day, carvedilol 6.25 mg twice a day and furosemide 20 mg daily.  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  In 2 to 3 months I am recommending a follow-up echo Doppler study on optimal therapy to see if there has been improvement in LV function.  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  He is followed at [facility].  I will see him in 3 to 4 months for follow-up evaluation or sooner as needed.   Medication Adjustments/Labs and Tests  Ordered: Current medicines are reviewed at length with the patient today.  Concerns regarding medicines are outlined above.  Medication changes, Labs and Tests ordered today are listed in the Patient Instructions below. Patient Instructions  Medication Instructions:  BEGIN Farxiga 79m daily.  *If you need a refill on your cardiac medications before your next appointment, please call your pharmacy*   Lab Work: None ordered.   Testing/Procedures: [facility]: Your physician has requested that you have an echocardiogram. Echocardiography is a painless test that uses sound waves to create images of your heart. It provides your doctor with information about the size and shape of your heart and how well your heart's chambers and valves are working. This procedure takes approximately one hour. There are no restrictions for this procedure.    Follow-Up: [facility] and your health needs are our priority.  As part of our continuing mission to provide you with exceptional heart care, we have created designated Provider Care Teams.  These Care Teams include your primary Cardiologist (physician) and Advanced Practice Providers (APPs -  Physician Assistants and Nurse Practitioners) who all work together to provide you with the care you need, when you need it.  We recommend signing up for the patient portal called "MyChart".  Sign up information is provided on this After Visit Summary.  MyChart is used to connect with patients for Virtual Visits (Telemedicine).  Patients are able to view lab/test results, encounter  notes, upcoming appointments, etc.  Non-urgent messages can be sent to your provider as well.   To learn more about what you can do with MyChart, go to [email or web address].    Your next appointment:   3-4 month(s)  The format for your next appointment:   In Person  Provider:   Shelva Majestic, MD        Signed, Shelva Majestic, MD   07/20/2020 7:12 PM    [facility] [address], [place], [place], [place]  [address] Phone: [phone]

## 2020-07-15 DIAGNOSIS — C099 Malignant neoplasm of tonsil, unspecified: Secondary | ICD-10-CM | POA: Insufficient documentation

## 2020-07-20 ENCOUNTER — Encounter: Payer: Self-pay | Admitting: Cardiovascular Disease

## 2020-07-30 DIAGNOSIS — C7802 Secondary malignant neoplasm of left lung: Secondary | ICD-10-CM | POA: Insufficient documentation

## 2020-08-11 ENCOUNTER — Other Ambulatory Visit (HOSPITAL_COMMUNITY): Payer: No Typology Code available for payment source

## 2020-09-14 ENCOUNTER — Other Ambulatory Visit: Payer: Self-pay

## 2020-09-14 ENCOUNTER — Ambulatory Visit (HOSPITAL_COMMUNITY): Payer: No Typology Code available for payment source | Attending: Cardiology

## 2020-09-14 DIAGNOSIS — I420 Dilated cardiomyopathy: Secondary | ICD-10-CM | POA: Diagnosis not present

## 2020-09-14 LAB — ECHOCARDIOGRAM COMPLETE
Area-P 1/2: 4.15 cm2
S' Lateral: 3.6 cm

## 2020-09-23 ENCOUNTER — Other Ambulatory Visit: Payer: Self-pay

## 2020-09-23 MED ORDER — SACUBITRIL-VALSARTAN 97-103 MG PO TABS: 1.0000 | ORAL_TABLET | Freq: Two times a day (BID) | ORAL | 3 refills | Status: DC

## 2020-10-20 MED ORDER — ENTRESTO 49-51 MG PO TABS: 1.0000 | ORAL_TABLET | Freq: Two times a day (BID) | ORAL | 1 refills | Status: DC

## 2020-10-20 MED ORDER — CARVEDILOL 6.25 MG PO TABS
6.2500 mg | ORAL_TABLET | Freq: Two times a day (BID) | ORAL | 1 refills | Status: DC
Start: 2020-10-20 — End: 2020-11-11

## 2020-10-21 ENCOUNTER — Ambulatory Visit: Payer: No Typology Code available for payment source | Admitting: Cardiovascular Disease

## 2020-11-11 ENCOUNTER — Other Ambulatory Visit: Payer: Self-pay | Admitting: Cardiovascular Disease

## 2021-01-31 ENCOUNTER — Other Ambulatory Visit: Payer: Self-pay

## 2021-01-31 ENCOUNTER — Encounter: Payer: Self-pay | Admitting: Cardiovascular Disease

## 2021-01-31 MED ORDER — ENTRESTO 49-51 MG PO TABS: 1.0000 | ORAL_TABLET | Freq: Two times a day (BID) | ORAL | 0 refills | Status: DC

## 2021-01-31 NOTE — Telephone Encounter (Signed)
Refill sent to pharmacy.   

## 2021-02-07 ENCOUNTER — Encounter: Payer: Self-pay | Admitting: Cardiovascular Disease

## 2021-02-08 NOTE — Telephone Encounter (Signed)
Patient assistance form signed and given to patient 02/07/21 . Patient in office to pick up

## 2021-02-14 ENCOUNTER — Other Ambulatory Visit: Payer: Self-pay | Admitting: Cardiovascular Disease

## 2021-03-03 ENCOUNTER — Other Ambulatory Visit: Payer: Self-pay

## 2021-03-03 ENCOUNTER — Encounter: Payer: Self-pay | Admitting: Cardiovascular Disease

## 2021-03-03 ENCOUNTER — Ambulatory Visit (INDEPENDENT_AMBULATORY_CARE_PROVIDER_SITE_OTHER): Payer: No Typology Code available for payment source | Admitting: Cardiovascular Disease

## 2021-03-03 DIAGNOSIS — I5042 Chronic combined systolic (congestive) and diastolic (congestive) heart failure: Secondary | ICD-10-CM | POA: Diagnosis not present

## 2021-03-03 DIAGNOSIS — I420 Dilated cardiomyopathy: Secondary | ICD-10-CM

## 2021-03-03 DIAGNOSIS — E039 Hypothyroidism, unspecified: Secondary | ICD-10-CM

## 2021-03-03 DIAGNOSIS — I452 Bifascicular block: Secondary | ICD-10-CM

## 2021-03-03 DIAGNOSIS — G4733 Obstructive sleep apnea (adult) (pediatric): Secondary | ICD-10-CM | POA: Diagnosis not present

## 2021-03-03 DIAGNOSIS — C4442 Squamous cell carcinoma of skin of scalp and neck: Secondary | ICD-10-CM | POA: Diagnosis not present

## 2021-03-03 NOTE — Progress Notes (Signed)
Cardiology Office Note    Date:  03/11/2021   ID:  Melvin Marshall, Melvin Marshall 07/21/1955, MRN [id number]  PCP:  [facility]  Cardiologist:  Shelva Majestic, MD   Chief Complaint  Patient presents with   Follow-up   8 month F/U cardiology evaluation.    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  I last saw him in May 2022.  He presents for a  follow-up cardiology evaluation.  When I initially saw Melvin Marshall, he denied any awareness of known cardiac disease. In late August 2019 he began to notice some increasing shortness of breath and felt he was having some respiratory difficulties.  He went to Urgent Care, was given a Z-Pak and was started on empiric albuterol and received a Medrol Dosepak.  He did not have definitive benefit from this treatment.  He also admitted to postnasal drainage and was taking some Claritin-D.  At the time he states his O2 saturation was 96% and his pulse was increased.  Over the month prior to my evaluation he noticed progressive shortness of breath particularly more with activity and with periods of agitation.  He also has noticed a vague chest pressure.  This past weekend while at work in the [employer] he was checked out apparently at the [employer].  He was not prescribed any therapy.  Because of increasing symptoms of shortness of breath, with development of PND orthopnea and at times feeling like he has to sit up at nighttime to breathe he walked into our on December 17, 2017 office  and was worked into my schedule and seen as an add-on.  When I initially saw him, I was concerned that his symptom complex was highly suggestive of acute systolic heart failure.  At the time, I did not know any laboratory status and in particular was unaware of his renal function.  He was tachycardic with a pulse of 111 and had a  summation gallop in addition to 2-3+ pitting edema to below the knees.  I initiated carvedilol at 3.125 mg twice a day and started furosemide 40 mg daily.  He underwent an chest x-ray which showed right basilar infiltrate with associated effusion.  Laboratory revealed an elevated BNP at 1653.  Creatinine was 1.07.  Hemoglobin 14.6, hematocrit 45.9.  Lipid studies revealed total cholesterol 169, HDL 31, LDL 116 and triglycerides 109.  TSH was 4.7.  I recommended he return to the office 1 week later for reassessment.  When I saw him on December 24, 2017 he was feeling significantly improved and had lost 16 pounds of fluid.  His lower extremity edema was significantly better.  He was breathing better and deniedchest pain or palpitations.  During that evaluation I initiated Entresto 24/26 mg twice a day and recommended support stockings.  I had recommended that he see our pharmacist 1 week later and if he was doing well on Entresto that Spironolactone 12.5 mg may be able to be started at that time.  He was seen by Cyril Mourning subsequent to my office visit and during her evaluation his blood pressure was low at 96/62.  He was asymptomatic.  He was advised to reduce Lasix from 40 mg down to 20 mg and deferred initiation of spironolactone at that time.  I saw him on January 21, 2018 time he was continuing to take  the 40 mg Lasix dose since he still had fluid and he has lost additional fluid.  In total he had lost 30 pounds since his initial evaluation from 213 down to 183 pounds.  He denied any dizziness or lightheadedness.  He denies chest pressure.  His leg edema has significantly improved although mild edema persists.  That evaluation, recommended he decrease furosemide down to 20 mg and further titrated Entresto to 49/51 mg twice a day.  His resting pulse was 85.  When I saw him in January 2020 he continued to feel significantly improved.  He was walking at least a mile 4 days/week.  He denied any lightheadedness  or dizziness.  Due to my concerns for obstructive sleep apnea he underwent a sleep study on September 29, 2018 which demonstrated at least moderate obstructive sleep apnea with an overall AHI of 15.7 and RDI of 18.7/h.  Sleep apnea was very severe with supine sleep with an AHI of 44.1/h.  He had loud snoring and oxygen nadir was 85%.  He has not yet undergone his CPAP titration and due to his significant reduction in LV function with EF of 20 to 25% prior to instituting Entresto, I had recommended an in lab titration in the event he had central apneas and BiPAP  was necessary.  I evaluated him in a telemedicine visit on 10/22/2018.Since I last saw him, he does admit to some weight gain.  With the [place] pandemic he has not been walking as much as he had previously but he is still walking a total of approximately 8 miles per week up to 4 days/week at variable distances.  He states his blood pressure has been fairly controlled and typically is in the 120s with his resting pulse typically in the 60s.  He admitted to some weight gain since his last visit which may be contributed by less diligence and may be some increased sodium intake.  He denied chest pain, palpitations,PND or orthopnea.    Melvin Marshall underwent his BiPAP titration on November 17, 2018.  CPAP was titrated to 11 cm.  At 10 cm he slept for 52 minutes and REM sleep and AHI was 0 and O2 nadir 94%.  Initial trial of CPAP therapy with EPR of 10 was recommended.  Apparently he has not yet had his CPAP initiation.  This will be at expedited.  Presently, he feels he is doing well with reference to his CHF.  His shortness of breath has resolved on his current regimen consisting of carvedilol 6.25 mg twice a day, furosemide 20 mg daily, Entresto 97/103 mg twice a day in addition to spironolactone 12.5 mg daily.  Recently he had inadvertently been taking the Entresto not twice a day and I reinforced the importance of twice daily regimen.  He underwent a  follow-up echo Doppler study on January 03, 2019 to reassess potential  improvement in LV function.  Unfortunately, EF remained low at less than 20%.  There was no LVH.  He had elevated left atrial left ventricular end-diastolic pressures.  There was grade 1 diastolic dysfunction.  There was mild MR and mild TR.  He presents for evaluation.   When I saw him for evaluation of January 29, 2019 I recommended a repeat evaluation with Dr. Lovena Le for consideration of prophylactic ICD for secondary prevention of sudden cardiac arrhythmic death.  He had not yet received a CPAP and recommended expedition office/set up.  Apparently, Mr. Sindelar never pursued EP evaluation due to financial issues.  He states that he had a $10,000 deductible and had 2 children with college and had recently had a pacer placed.  As result he has not yet been on CPAP nor has he seen EP for consideration of ICD therapy.  I saw him in June 2021 at which time he felt he was doing well.  He continued to take carvedilol 6.25 mg twice a day, Entresto 97/103 mg twice a day, spironolactone 12.5 mg daily and Lasix 20 mg.  He is unaware of palpitations, presyncope or syncope.  He denied chest pain.  I saw him in November 2021 and since his prior evaluation he had remained cardiac stable.  Apparently he had purchased a CPAP device online but had not yet begun therapy.  He will need to bring his machine in for device settings and have it be linked to our office.  He does not have a DME company and they would not arrange his settings.  He is still working 5 days/week.  He denied chest pain or exertional dyspnea. Blood pressure at home typically runs around 110/70.  He was unaware of palpitations, presyncope or syncope.    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  He is undergoing evaluation at [facility] and has  had a PET scan in addition to CT and is scheduled to see her radiology oncologist at [facility].  He has lost 47 pounds.  He denies any chest pain.  His breathing is good without dyspnea.  He has not instituted the CPAP since the machine he bought has not been set.  It had never been linked to our office.  During that evaluation, I informed him that we needed to obtain the serial number so that we may link his machine to our office and institute AutoPap.  He had felt better since initiating Entresto and was at maximum dose 97/103 twice a day, spironolactone 12.5 mg every other day, carvedilol 6.25 mg twice a day, and furosemide.  I recommended initiation of SGLT2 and started him on Farxiga 10 mg daily.  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  He has undergone 3 cycles of chemotherapy.  He currently is on Keytruda.  He was most recently evaluated by Dr. Jacqulyn Ducking at [facility] for hematology/oncology follow-up evaluation.  He had developed increased TSH.  Presently, he denies any chest pain.  He denies significant shortness of breath and continues to be on guideline directed medical therapy for his severely reduced LV function with carvedilol 6.25 mg twice a day, Farxiga 10 mg daily, Entresto now at 49/51 mg twice a day, furosemide 20 mg, and spironolactone 12.5 mg every other day.  He is now on levothyroxine at 75 mcg.  Previously his TSH had increased to 61.  The CPAP that he had purchased is a ResMed air sense 10 and it appears that this is a 2015 model.  As result it is not wireless due to abrasions 5G technology.  He presents for evaluation    Current Medications: Outpatient Medications Prior to Visit  Medication Sig Dispense Refill   carvedilol (COREG) 6.25 MG tablet TAKE 1 TABLET BY MOUTH TWICE A DAY 180 tablet 2   diphenhydrAMINE (BENADRYL) 25 mg capsule Take  25 mg by mouth at bedtime.     furosemide (LASIX) 20 MG tablet Take 1 tablet (20 mg total) by mouth daily. 90 tablet 3   levothyroxine (SYNTHROID) 75 MCG tablet Take 75 mcg by mouth daily.     sacubitril-valsartan (ENTRESTO) 49-51 MG Take 1 tablet by mouth 2 (two) times daily. Please keep Jan 5th appointment. 60 tablet 0   spironolactone (ALDACTONE) 25 MG tablet TAKE 0.5 TABLETS (12.5 MG TOTAL) BY MOUTH EVERY OTHER DAY. 23 tablet 1   dapagliflozin propanediol (FARXIGA) 10 MG TABS tablet Take 1 tablet (10 mg total) by mouth daily before breakfast. (Patient not taking: Reported on 03/03/2021) 90 tablet 3   No facility-administered medications prior to visit.     Allergies:   Patient has no known allergies.   Social History   Socioeconomic History   Marital status: Married    Spouse name: Not on file   Number of children: 4   Years of education: Not on file   Highest education level: Bachelor's degree (e.g., BA, AB, BS)  Occupational History   Occupation: optometry    Comment: self employed  Tobacco Use   Smoking status: Never   Smokeless tobacco: Never  Substance and Sexual Activity   Alcohol use: Not Currently   Drug use: Never   Sexual activity: Not on file  Other Topics Concern   Not on file  Social History Narrative   Not on file   Social Determinants of Health   Financial Resource Strain: Not on file  Food Insecurity: Not on file  Transportation Needs: Not on file  Physical Activity: Not on file  Stress: Not on file  Social Connections: Not on file    Socially he was born in [place]. He did his undergraduate degree at [employer] and ultimately attended [employer] in [place].  He is married for 37 years.  There is no tobacco or alcohol use. He has 4 children.  Family History:  The patient's family history includes COPD in his father; Diabetes type I in his child; Heart failure in his father; Liver disease in his mother; Suicidality in his paternal  grandfather.   His mother died at age 63 and had liver disease from a transfusion, father died at 47 and had CHF and COPD.  He has 3 sisters ages 76, 85, and 36.  ROS General: Negative; No fevers, chills, or night sweats;  HEENT: Squamous cell carcinoma p16 positive of the right tonsil. Pulmonary: Negative; No cough, wheezing, shortness of breath, hemoptysis Cardiovascular: See HPI GI: Negative; No nausea, vomiting, diarrhea, or abdominal pain GU: Negative; No dysuria, hematuria, or difficulty voiding Musculoskeletal: Negative; no myalgias, joint pain, or weakness Hematologic/Oncology: squamous cell carcinoma of right neck Endocrine: Negative; no heat/cold intolerance; no diabetes Neuro: Negative; no changes in balance, headaches Skin: Negative; No rashes or skin lesions Psychiatric: Negative; No behavioral problems, depression Sleep: Positive for OSA status post  PSG and CPAP titration.  History of loud snoring., daytime sleepiness, hypersomnolence, bruxism, restless legs, hypnogognic hallucinations, no cataplexy.  He never followed up with CPAP set up with DME company. Other comprehensive 14 point system review is negative.   PHYSICAL EXAM:   VS:  BP 118/72    Pulse 61    Ht $R'5\' 6"'nd$  (1.676 m)    Wt 201 lb 12.8 oz (91.5 kg)    SpO2 100%    BMI 32.57 kg/m   Repeat blood pressure by me 118/70  Wt Readings from Last  3 Encounters:  03/03/21 201 lb 12.8 oz (91.5 kg)  07/14/20 166 lb (75.3 kg)  01/01/20 186 lb 3.2 oz (84.5 kg)   General: Alert, oriented, no distress.  Skin: normal turgor, no rashes, warm and dry HEENT: Normocephalic, atraumatic. Pupils equal round and reactive to light; sclera anicteric; extraocular muscles intact; Nose without nasal septal hypertrophy Mouth/Parynx benign; Mallinpatti scale 3 Neck: No JVD, no carotid bruits; normal carotid upstroke; previously enlarged right inframandibular lymph node improved Lungs: clear to ausculatation and percussion; no wheezing  or rales Chest wall: without tenderness to palpitation Heart: PMI not displaced, RRR, s1 s2 normal, 1/6 systolic murmur, no diastolic murmur, no rubs, gallops, thrills, or heaves Abdomen: soft, nontender; no hepatosplenomehaly, BS+; abdominal aorta nontender and not dilated by palpation. Back: no CVA tenderness Pulses 2+ Musculoskeletal: full range of motion, normal strength, no joint deformities Extremities: no clubbing cyanosis or edema, Homan's sign negative  Neurologic: grossly nonfocal; Cranial nerves grossly wnl Psychologic: Normal mood and affect     Studies/Labs Reviewed:   March 03, 2021 ECG (independently read by me): Sinus rhythm.at 61, , RBBB, LAHB, PVC  Jul 14, 2020 ECG (independently read by me): NSR at 62; RBBB, LAHB,   January 01, 2020 ECG (independently read by me): Sinus bradycardia at 58; RBBB, LAHB, no ectopy  July 30, 2019 ECG (independently read by me): Normal sinus rhythm at 75 bpm.  Right bundle branch block with repolarization changes.  QTc interval 491 ms.  Small anteroseptal Q waves.  January 29, 2019 ECG (independently read by me): Sinus bradycardia 54 bpm.  Right bundle branch block and left anterior hemiblock.  QTc interval 474 ms.  March 07, 2018 ECG (independently read by me): Sinus rhythm at 94,  RBBB, isolated PVC  January 21, 2018 ECG (independently read by me): Normal sinus rhythm at 85 bpm.  Right bundle branch block with repolarization changes.  Left anterior hemiblock.  No ectopy.  December 24, 2017 ECG (independently read by me): NSR at 94; RBBB with repolarization changes; QTc 512 msec  December 17, 2017 ECG (independently read by me): Sinus tachycardia at 111 bpm.  PVC.  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  PR interval 158 ms.  QTc interval 505 ms.    I personally reviewed the ECG that he had done at the [facility] on December 15, 2017 which shows sinus rhythm at 98 with right bundle branch block  and left anterior hemiblock; PVC.   Recent Labs: BMP Latest Ref Rng & Units 09/30/2018 12/17/2017  Glucose 65 - 99 mg/dL 86 110(H)  BUN 8 - 27 mg/dL 17 30(H)  Creatinine 0.76 - 1.27 mg/dL 0.98 1.07  BUN/Creat Ratio 10 - 24 17 28(H)  Sodium 134 - 144 mmol/L 141 142  Potassium 3.5 - 5.2 mmol/L 4.2 4.4  Chloride 96 - 106 mmol/L 103 104  CO2 20 - 29 mmol/L 24 21  Calcium 8.6 - 10.2 mg/dL 8.8 9.0     Hepatic Function Latest Ref Rng & Units 09/30/2018 12/17/2017  Total Protein 6.0 - 8.5 g/dL 7.0 6.5  Albumin 3.8 - 4.8 g/dL 4.1 4.1  AST 0 - 40 IU/L 14 22  ALT 0 - 44 IU/L 12 22  Alk Phosphatase 39 - 117 IU/L 61 53  Total Bilirubin 0.0 - 1.2 mg/dL 0.3 0.7    CBC Latest Ref Rng & Units 12/17/2017  WBC 3.4 - 10.8 x10E3/uL 4.3  Hemoglobin 13.0 - 17.7 g/dL 14.6  Hematocrit  37.5 - 51.0 % 45.9  Platelets 150 - 450 x10E3/uL 172   Lab Results  Component Value Date   MCV 83 12/17/2017   Lab Results  Component Value Date   TSH 4.700 (H) 12/17/2017   No results found for: HGBA1C   BNP    Component Value Date/Time   BNP 1,653.5 (H) 12/17/2017 1026    ProBNP    Component Value Date/Time   PROBNP 760 (H) 09/30/2018 1542     Lipid Panel     Component Value Date/Time   CHOL 169 12/17/2017 1026   TRIG 109 12/17/2017 1026   HDL 31 (L) 12/17/2017 1026   CHOLHDL 5.5 (H) 12/17/2017 1026   LDLCALC 116 (H) 12/17/2017 1026     RADIOLOGY: No results found.   Additional studies/ records that were reviewed today include:  Reviewed his laboratory, chest x-ray, and echo Doppler study.  ------------------------------------------------------------------- 12/21/2017 ECHO Study Conclusions   - Left ventricle: The cavity size was severely dilated. Wall   thickness was increased in a pattern of mild LVH. Systolic   function was severely reduced. The estimated ejection fraction   was in the range of 20% to 25%. Irregular apical contour, no   definite thrombus with Definity contrast.  Severe global   hypokinesis with regional variation. The study is not technically   sufficient to allow evaluation of LV diastolic function. - Aortic valve: Trileaflet; mildly thickened leaflets. There was no   regurgitation. - Mitral valve: Mildly thickened leaflets . There was moderate   regurgitation. - Left atrium: Severely dilated. - Right ventricle: The cavity size was mildly dilated. Mild   systolic dysfunction. - Right atrium: The atrium was mildly dilated. - Tricuspid valve: There was moderate regurgitation. - Pulmonary arteries: PA peak pressure: 56 mm Hg (S). - Inferior vena cava: The vessel was dilated. The respirophasic   diameter changes were blunted (< 50%), consistent with elevated   central venous pressure. - Pericardium, extracardiac: Small pericardial effusion (mostly   posterior).   Impressions:   - LVEF 20-25%, severely dilated LV with mild LVH, severe global   hypokinesis with regional variation, moderate MR, severe LAE,   mild RVE with mild RV systolic dysfunction, mild RAE, moderate   TR, RVSP 56 mmHg, dilated IVC, small mostly posterior pericardial   effusion.   ECHO 01/03/2019 1. Left ventricular ejection fraction, by visual estimation, is <20%. The left ventricle has severely decreased function. There is no left ventricular hypertrophy. 2. Elevated left atrial and left ventricular end-diastolic pressures. 3. Left ventricular diastolic parameters are consistent with Grade I diastolic dysfunction (impaired relaxation). 4. Moderately dilated left ventricular internal cavity size. 5. Global right ventricle has normal systolic function.The right ventricular size is normal. No increase in right ventricular wall thickness. 6. Left atrial size was normal. 7. Right atrial size was normal. 8. The mitral valve is normal in structure. Mild mitral valve regurgitation. No evidence of mitral stenosis. 9. The tricuspid valve is normal in structure. Tricuspid valve  regurgitation is mild. 10. The aortic valve is normal in structure. Aortic valve regurgitation is not visualized. No evidence of aortic valve sclerosis or stenosis. 11. The pulmonic valve was normal in structure. Pulmonic valve regurgitation is mild. 12. The inferior vena cava is normal in size with greater than 50% respiratory variability, suggesting right atrial pressure of 3 mmHg.   CPAP TITRATION: 11/17/2018 CLINICAL INFORMATION The patient is referred for a CPAP titration to treat sleep apnea.   Date of NPSG:  09/29/2018: AHI 15.7/h; RDI 18.7/h; supine AHI 44.1/h; O2 nadie 85%.   SLEEP STUDY TECHNIQUE As per the AASM Manual for the Scoring of Sleep and Associated Events v2.3 (April 2016) with a hypopnea requiring 4% desaturations.   The channels recorded and monitored were frontal, central and occipital EEG, electrooculogram (EOG), submentalis EMG (chin), nasal and oral airflow, thoracic and abdominal wall motion, anterior tibialis EMG, snore microphone, electrocardiogram, and pulse oximetry. Continuous positive airway pressure (CPAP) was initiated at the beginning of the study and titrated to treat sleep-disordered breathing.   MEDICATIONS     aspirin 81 MG chewable tablet             carvedilol (COREG) 6.25 MG tablet         COD LIVER OIL PO         furosemide (LASIX) 20 MG tablet (Expired)         Omega-3 Fatty Acids (FISH OIL OMEGA-3 PO)         sacubitril-valsartan (ENTRESTO) 97-103 MG         spironolactone (ALDACTONE) 25 MG tablet (Expired)       Medications self-administered by patient taken the night of the study : N/A   TECHNICIAN COMMENTS Comments added by technician: Patient had difficulty initiating sleep. F&P VITERA FULL FACE MASK MEDIUM WAS USED FOR THIS STUDY Comments added by scorer: N/A   RESPIRATORY PARAMETERS Optimal PAP Pressure (cm):  11        AHI at Optimal Pressure (/hr):            0.0 Overall Minimal O2 (%):         90.0     Supine % at Optimal  Pressure (%):    0 Minimal O2 at Optimal Pressure (%): 94.0        SLEEP ARCHITECTURE The study was initiated at 10:59:11 PM and ended at 5:01:02 AM.   Sleep onset time was 4.7 minutes and the sleep efficiency was 86.5%%. The total sleep time was 313.1 minutes.   The patient spent 2.7%% of the night in stage N1 sleep, 75.9%% in stage N2 sleep, 0.0%% in stage N3 and 21.4% in REM.Stage REM latency was 92.0 minutes   Wake after sleep onset was 44.0. Alpha intrusion was absent. Supine sleep was 16.45%.   CARDIAC DATA The 2 lead EKG demonstrated sinus rhythm. The mean heart rate was 66.0 beats per minute. Other EKG findings include: PVCs.   LEG MOVEMENT DATA The total Periodic Limb Movements of Sleep (PLMS) were 0. The PLMS index was 0.0. A PLMS index of <15 is considered normal in adults.   IMPRESSIONS - CPAP was initiated at 5 cm and was titrated to11 cm of water. At 10 cm patient slept for 52 minutes in REM sleep; AHI 0 and O2 nadir 94%. AHI at 11 cm is 0.  - Central sleep apnea was not noted during this titration (CAI = 0.8/h). - Significant oxygen desaturations were not observed during this titration (min O2 = 90.0%). - No snoring was audible during this study. - 2-lead EKG demonstrated: PVCs - Clinically significant periodic limb movements were not noted during this study. Arousals associated with PLMs were rare.   DIAGNOSIS - Obstructive Sleep Apnea (327.23 [G47.33 ICD-10])   RECOMMENDATIONS - Recommend an initial trial of CPAP therapy with EPR at 10 cm H2O with heated humidification. A Medium size Fisher&Paykel Full Face Mask F&P Vitera (new) mask was used for the titration. - Effort should be made to  optimize nasal and oropharyngeal patency. - Avoid alcohol, sedatives and other CNS depressants that may worsen sleep apnea and disrupt normal sleep architecture. - Sleep hygiene should be reviewed to assess factors that may improve sleep quality. - Weight management (BMI 32) and  regular exercise should be initiated or continued. - Recommend a download in 30 days and sleep clinic evaluation after 4 weeks of therapy.   [Electronically signed] 11/22/2018 04:52 PM    ASSESSMENT:    1. Dilated cardiomyopathy ([place])   2. Chronic combined systolic and diastolic heart failure ([place])   3. Squamous cell carcinoma of neck   4. OSA (obstructive sleep apnea)   5. Bifascicular block   6. 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  Subsequently, he developed progressive symptoms of shortness of breath.  When I initially saw him, he was having PND, orthopnea, had 2-3+ pitting edema, and a summation gallop on exam.  Subsequent evaluation has confirmed my suspicion for CHF.  His echo Doppler study revealed an EF of 20 to 25% with significant LV dilation at end diastole at 68 mm in end systole at 58 mm.  There was moderate MR.  He had severe left atrial dilatation, mild right atrial dilatation, moderate TR, and moderate pulmonary hypertension with a peak PA pressure of 56.  He has successfully been treated with guideline directed medical therapy for his combined systolic and diastolic heart failure.  He was titrated to a regimen consisting of carvedilol 6.25 mg twice a day, furosemide 20 mg daily, Entresto 97/103 twice a day, and spironolactone 12.5 mg daily.  When I saw him in December 2020 he was not consistently taking Entresto twice a day and I strongly encouraged to do this.  A follow-up echo Doppler  continued to show reduced LV function.  I recommended prophylactic ICD therapy but he never pursued this due to financial constraints.  I also referred him for a sleep study with CPAP titration due to significant sleep apnea but he never pursued CPAP set  up.  Again he ultimately came in the office and discussed the adverse cardiovascular consequences with his significant sleep apnea if left untreated.  Due to cost issues, I  recommended he try buying equipment online with [email or web address].  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  He received carboplatin plus fluorouracil and pembrolizumab.  He is followed by Dr. Harlow Asa of hematology oncology at [facility].  I reviewed his most recent evaluation from February 23, 2021.  He subsequently developed hypothyroidism and is now on levothyroxine.  His blood pressure today is well controlled now on his medical regimen consisting of Entresto at 49/51 twice a day, Farxiga 10 mg daily, carvedilol 6.25 mg twice a day, furosemide 20 mg daily, and spironolactone 12.5 mg every other day.  I reviewed his most recent echo Doppler study from September 14, 2020 which continue to show severe LV dysfunction with EF of 25 to 30%.  There was mild LVH and wall motion abnormality anteriorly.  There was grade 1 diastolic dysfunction and mild dilation of his left atrium.  He denies any chest pain.  He  denies any exertional dyspnea.  He brought his CPAP machine with him today.  I personally interrogated his unit, provided him with a card since his machine is not wireless, and changed his settings to a ramp start pressure at 6, and since his machine is not an auto unit I set his CPAP at 10 cm water pressure with EPR of 3.  He has a fullface mask which we previously had provided him.    Medication Adjustments/Labs and Tests Ordered: Current medicines are reviewed at length with the patient today.  Concerns regarding medicines are outlined above.  Medication changes, Labs and Tests ordered today are listed in the Patient Instructions below. Patient Instructions  Medication Instructions:  The current medical  regimen is effective;  continue present plan and medications as directed. Please refer to the Current Medication list given to you today.   *If you need a refill on your cardiac medications before your next appointment, please call your pharmacy*  Lab Work:   Testing/Procedures:  NONE    NONE  Special Instructions BRING MACHINE IN FOR DOWNLOAD IN 1 MONTH. BRING YOUR MACHINE WITH YOU  FOLLOW UP WITH YOUR ONCOLOGIST  Follow-Up: Your next appointment:  4-6  month(s) In Person with DR Claiborne Billings   At [facility], you and your health needs are our priority.  As part of our continuing mission to provide you with exceptional heart care, we have created designated Provider Care Teams.  These Care Teams include your primary Cardiologist (physician) and Advanced Practice Providers (APPs -  Physician Assistants and Nurse Practitioners) who all work together to provide you with the care you need, when you need it.    Signed, Shelva Majestic, MD  03/11/2021 12:46 PM    [place] [address], [place], [place], [place]  [address] Phone: [phone]

## 2021-03-03 NOTE — Patient Instructions (Signed)
Medication Instructions:  The current medical regimen is effective;  continue present plan and medications as directed. Please refer to the Current Medication list given to you today.   *If you need a refill on your cardiac medications before your next appointment, please call your pharmacy*  Lab Work:   Testing/Procedures:  NONE    NONE  Special Instructions BRING MACHINE IN FOR DOWNLOAD IN 1 MONTH. BRING YOUR MACHINE WITH YOU  FOLLOW UP WITH YOUR ONCOLOGIST  Follow-Up: Your next appointment:  4-6  month(s) In Person with DR Claiborne Billings   At [facility], you and your health needs are our priority.  As part of our continuing mission to provide you with exceptional heart care, we have created designated Provider Care Teams.  These Care Teams include your primary Cardiologist (physician) and Advanced Practice Providers (APPs -  Physician Assistants and Nurse Practitioners) who all work together to provide you with the care you need, when you need it.

## 2021-03-11 ENCOUNTER — Encounter: Payer: Self-pay | Admitting: Cardiovascular Disease

## 2021-03-25 NOTE — Addendum Note (Signed)
Addended by: Wonda Horner on: 03/25/2021 03:49 PM   Modules accepted: Orders

## 2021-05-10 ENCOUNTER — Other Ambulatory Visit: Payer: Self-pay | Admitting: Cardiovascular Disease

## 2021-05-12 ENCOUNTER — Other Ambulatory Visit: Payer: Self-pay

## 2021-05-12 MED ORDER — ENTRESTO 49-51 MG PO TABS: 1.0000 | ORAL_TABLET | Freq: Two times a day (BID) | ORAL | 3 refills | Status: DC

## 2021-08-15 ENCOUNTER — Other Ambulatory Visit: Payer: Self-pay | Admitting: Cardiovascular Disease

## 2021-11-15 ENCOUNTER — Other Ambulatory Visit: Payer: Self-pay | Admitting: Cardiovascular Disease

## 2021-11-28 ENCOUNTER — Other Ambulatory Visit: Payer: Self-pay | Admitting: Cardiovascular Disease

## 2021-12-12 ENCOUNTER — Other Ambulatory Visit: Payer: Self-pay | Admitting: Cardiovascular Disease

## 2022-01-15 ENCOUNTER — Other Ambulatory Visit: Payer: Self-pay | Admitting: Cardiovascular Disease

## 2022-04-12 DIAGNOSIS — G4733 Obstructive sleep apnea (adult) (pediatric): Secondary | ICD-10-CM | POA: Insufficient documentation

## 2022-04-12 NOTE — Progress Notes (Signed)
Cardiology Clinic Note   Patient Name: Melvin Marshall Marshall Date of Encounter: 04/14/2022  Primary Care Provider:  [facility] Primary Cardiologist:  Dr. 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  Allergies  No Known Allergies  History of Present Illness    Melvin Marshall Marshall is a 67 year old male we are seeing for ongoing assessment and management of HFrEF with most recent echo in 2022, EF of 20 to 25% with significant LV dilation at end diastole at 68 mm in end systole at 58 mm. There was moderate MR. He had severe left atrial dilatation, mild right atrial dilatation, moderate TR, and moderate pulmonary hypertension with a peak PA pressure of 56.   He is also being treated for OSA on CPAP, managed by Melvin Marshall Marshall as well. ON GDMT but has refused ICD placement. Also lung cancer with mets from squamous cell carcinoma from the nose. Last seen 03/03/2021.  Melvin Marshall Marshall is a very pleasant gentleman who comes today without any significant complaints.  He has NYHA class I symptoms.  He denies any chest discomfort.  He has not been as active over the winter as he likes to walk outside and work on his land during the warmer months.  He continues his clinical practice without any issues concerning energy.  He also continues his cancer treatment and is also using alternate therapies to include fasting.  He is  working with his oncologist concerning other treatment modalities.  He states that he is chosen not to take [place] when offered to him on last office visit as he was unconvinced that this would be helpful.  He does need refills on his carvedilol.  He states that sometimes he does have some periods of mild depression which passes after a few hours usually on the weekends.  He remains as active as he can be and is committed to his overall health.  Of note, the last time his thyroid was checked it was elevated at 6.7 and this impacted his a dose of Entresto.  His thyroid is now under better control through his PCP.  He continues on his initial dose of Entresto 49/51 mg.  Home Medications    Current Outpatient Medications  Medication Sig Dispense Refill   hydroxychloroquine (PLAQUENIL) 200 MG tablet Take by mouth.     carvedilol (COREG) 6.25 MG tablet TAKE 1 TABLET BY MOUTH 2 (TWO) TIMES DAILY. SCHEDULE AN APPOINTMENT FOR FURTHER REFILLS 180 tablet 0   dapagliflozin propanediol (FARXIGA) 10 MG TABS tablet Take 1 tablet (10 mg total) by mouth daily before breakfast. (Patient not taking: Reported on 03/03/2021) 90 tablet 3   diphenhydrAMINE (BENADRYL) 25 mg capsule Take 25 mg by mouth at bedtime.     furosemide (LASIX) 20 MG tablet TAKE 1 TABLET BY MOUTH EVERY DAY 90  tablet 3   levothyroxine (SYNTHROID) 100 MCG tablet Take 100 mcg by mouth daily.     sacubitril-valsartan (ENTRESTO) 49-51 MG Take 1 tablet by mouth 2 (two) times daily. 180 tablet 3   spironolactone (ALDACTONE) 25 MG tablet Take 0.5 tablets (12.5 mg total) by mouth every other day. 22 tablet 3   No current facility-administered medications for this visit.     Family History    Family History  Problem Relation Age of Onset   Liver disease Mother    Heart failure Father    COPD Father    Suicidality Paternal Grandfather    Diabetes type I Child    He indicated that his mother is deceased. He indicated that his father is deceased.  He indicated that his sister is alive. He indicated that his maternal grandmother is deceased. He indicated that his maternal grandfather is deceased. He indicated that his paternal grandmother is deceased. He indicated that his paternal grandfather is deceased. He indicated that all of his four children are alive.  Social History    Social History   Socioeconomic History   Marital status: Married    Spouse name: Not on file   Number of children: 4   Years of education: Not on file   Highest education level: Bachelor's degree (e.g., BA, AB, BS)  Occupational History   Occupation: optometry    Comment: self employed  Tobacco Use   Smoking status: Never   Smokeless tobacco: Never  Substance and Sexual Activity   Alcohol use: Not Currently   Drug use: Never   Sexual activity: Not on file  Other Topics Concern   Not on file  Social History Narrative   Not on file   Social Determinants of Health   Financial Resource Strain: Not on file  Food Insecurity: Not on file  Transportation Needs: Not on file  Physical Activity: Not on file  Stress: Not on file  Social Connections: Not on file  Intimate Partner Violence: Not on file     Review of Systems    General:  No chills, fever, night sweats or weight changes.  Cardiovascular:  No chest pain, dyspnea on exertion, edema, orthopnea, palpitations, paroxysmal nocturnal dyspnea. Dermatological: No rash, lesions/masses Respiratory: No cough, dyspnea Urologic: No hematuria, dysuria Abdominal:   No nausea, vomiting, diarrhea, bright red blood per rectum, melena, or hematemesis Neurologic:  No visual changes, wkns, changes in mental status. All other systems reviewed and are otherwise negative except as noted above.     Physical Exam    VS:  BP 104/66   Pulse 82   Ht 5\' 6"  (1.676 m)   Wt 180 lb 6.4 oz (81.8 kg)   BMI 29.12 kg/m  , BMI Body mass index is 29.12 kg/m.     GEN: Well nourished, well developed, in no acute  distress. HEENT: normal. Neck: Supple, no JVD, carotid bruits, or masses. Cardiac: RRR, distant heart sounds, no murmurs, rubs, or gallops. No clubbing, cyanosis, edema.  Radials/DP/PT 2+ and equal bilaterally.  Respiratory:  Respirations regular and unlabored, clear to auscultation bilaterally. GI: Soft, nontender, nondistended, BS + x 4. MS: no deformity or atrophy.  Port-A-Cath to right upper chest. Skin: warm and dry, no rash. Neuro:  Strength and sensation are intact. Psych: Normal affect.  Accessory Clinical Findings    ECG personally reviewed by me today-not completed this office visit  Lab Results  Component Value Date   WBC 4.3 12/17/2017   HGB 14.6 12/17/2017  HCT 45.9 12/17/2017   MCV 83 12/17/2017   PLT 172 12/17/2017   Lab Results  Component Value Date   CREATININE 0.98 09/30/2018   BUN 17 09/30/2018   NA 141 09/30/2018   K 4.2 09/30/2018   CL 103 09/30/2018   CO2 24 09/30/2018   Lab Results  Component Value Date   ALT 12 09/30/2018   AST 14 09/30/2018   ALKPHOS 61 09/30/2018   BILITOT 0.3 09/30/2018   Lab Results  Component Value Date   CHOL 169 12/17/2017   HDL 31 (L) 12/17/2017   LDLCALC 116 (H) 12/17/2017   TRIG 109 12/17/2017   CHOLHDL 5.5 (H) 12/17/2017    No results found for: "HGBA1C"  Review of Prior Studies:  Echocardiogram 09/14/2020 1. Left ventricular ejection fraction, by estimation, is 25 to 30%. The  left ventricle has severely decreased function. The left ventricle  demonstrates regional wall motion abnormalities (see scoring  diagram/findings for description). There is mild left   ventricular hypertrophy. Left ventricular diastolic parameters are  consistent with Grade I diastolic dysfunction (impaired relaxation).   2. Right ventricular systolic function is normal. The right ventricular  size is normal. Tricuspid regurgitation signal is inadequate for assessing  PA pressure.   3. Left atrial size was mildly dilated.   4.  The mitral valve is normal in structure. Trivial mitral valve  regurgitation.   5. The aortic valve is tricuspid. Aortic valve regurgitation is not  visualized. No aortic stenosis is present.   6. The inferior vena cava is dilated in size with >50% respiratory  variability, suggesting right atrial pressure of 8 mmHg.    Assessment & Plan   1.  HFrEF: Patient has NYHA class I symptoms, denies any significant exertional shortness of breath.  He has been more sedentary over the wintertime.  Initially he chose not to take [place] but is now willing to do so.  Farxiga 10 mg daily is going to be added to his medication regimen, refills on carvedilol and Entresto are provided.  I have advised him that he may notice increased diuresis on Farxiga, at which time we may need to back off a bit on his Lasix depending upon how he feels and his blood pressure control.  Samples are provided and he will follow-up with Dr. Claiborne Billings in May 2024 for reevaluation.  He needs labs and would prefer to have these drawn through his port through the cancer practice.  I am repeating his echocardiogram for comparison to 2022.  Continue GDMT.  Labs from care everywhere creatinine 0.83 on 03/30/2022.  2.  Hypothyroidism: Medication adjustments by PCP with follow-up labs.  3.  Carcinoma of the left lung: Followed by Dr. Harlow Asa at [facility] oncology.  He is choosing to forego radiation at this time and is continued on surveillance and labs through their facility.  4.  Cardiovascular risk factor screening: I would like him to have fasting lipids and LFTs on next visit with his oncologist.  He prefers to have labs drawn through his Port-A-Cath and he will request on next visit.  Current medicines are reviewed at length with the patient today.  I have spent 25 min's  dedicated to the care of this patient on the date of this encounter to include pre-visit review of records, assessment, management and diagnostic  testing,with shared decision making.  Signed, Phill Myron. West Pugh, ANP, AACC   04/14/2022 8:26 AM      Office [phone] Fax [phone]  Notice: This dictation was prepared with Dragon dictation along with smaller phrase technology. Any transcriptional errors that result from this process are unintentional and may not be corrected upon review.

## 2022-04-14 ENCOUNTER — Encounter: Payer: Self-pay | Admitting: Adult Health

## 2022-04-14 ENCOUNTER — Ambulatory Visit: Payer: No Typology Code available for payment source | Attending: Adult Health | Admitting: Adult Health

## 2022-04-14 VITALS — BP 104/66 | HR 82 | Ht 66.0 in | Wt 180.4 lb

## 2022-04-14 DIAGNOSIS — I5041 Acute combined systolic (congestive) and diastolic (congestive) heart failure: Secondary | ICD-10-CM

## 2022-04-14 DIAGNOSIS — G4733 Obstructive sleep apnea (adult) (pediatric): Secondary | ICD-10-CM | POA: Diagnosis not present

## 2022-04-14 MED ORDER — ENTRESTO 49-51 MG PO TABS: 1.0000 | ORAL_TABLET | Freq: Two times a day (BID) | ORAL | 3 refills | Status: DC

## 2022-04-14 MED ORDER — DAPAGLIFLOZIN PROPANEDIOL 10 MG PO TABS: 10.0000 mg | ORAL_TABLET | Freq: Every day | ORAL | 0 refills | Status: AC

## 2022-04-14 MED ORDER — DAPAGLIFLOZIN PROPANEDIOL 10 MG PO TABS: 10.0000 mg | ORAL_TABLET | Freq: Every day | ORAL | 3 refills | Status: AC

## 2022-04-14 MED ORDER — CARVEDILOL 6.25 MG PO TABS: ORAL_TABLET | ORAL | 2 refills | Status: DC

## 2022-04-14 NOTE — Patient Instructions (Signed)
Medication Instructions:  Start Farxiga 10 mg ( Take 1 Tablet Daily). *If you need a refill on your cardiac medications before your next appointment, please call your pharmacy*   Lab Work: No Labs If you have labs (blood work) drawn today and your tests are completely normal, you will receive your results only by: [place] (if you have MyChart) OR A paper copy in the mail If you have any lab test that is abnormal or we need to change your treatment, we will call you to review the results.   Testing/Procedures: [address] [place] has requested that you have an echocardiogram. Echocardiography is a painless test that uses sound waves to create images of your heart. It provides your doctor with information about the size and shape of your heart and how well your heart's chambers and valves are working. This procedure takes approximately one hour. There are no restrictions for this procedure. Please do NOT wear cologne, perfume, aftershave, or lotions (deodorant is allowed). Please arrive 15 minutes prior to your appointment time.    Follow-Up: At [facility], you and your health needs are our priority.  As part of our continuing mission to provide you with exceptional heart care, we have created designated Provider Care Teams.  These Care Teams include your primary Cardiologist (physician) and Advanced Practice Providers (APPs -  Physician Assistants and Nurse Practitioners) who all work together to provide you with the care you need, when you need it.  We recommend signing up for the patient portal called "MyChart".  Sign up information is provided on this After Visit Summary.  MyChart is used to connect with patients for Virtual Visits (Telemedicine).  Patients are able to view lab/test results, encounter notes, upcoming appointments, etc.  Non-urgent messages can be sent to your provider as well.   To learn more about what you can do with  MyChart, go to [email or web address].    Your next appointment:   Keep Scheduled Appointment  Provider:   Shelva Majestic, MD   Other Instructions Recommend to have Lipid Panel Done at Neck appointment with oncology.

## 2022-04-18 NOTE — Telephone Encounter (Signed)
1 bottle of Entresto- 49/51mg  LOT [id number] EXP: 12/2023 left at front desk for pick up.

## 2022-05-11 ENCOUNTER — Other Ambulatory Visit: Payer: Self-pay

## 2022-05-11 MED ORDER — ENTRESTO 49-51 MG PO TABS: 1.0000 | ORAL_TABLET | Freq: Two times a day (BID) | ORAL | 3 refills | Status: AC

## 2022-05-16 ENCOUNTER — Other Ambulatory Visit: Payer: Self-pay | Admitting: Cardiovascular Disease

## 2022-05-16 ENCOUNTER — Telehealth: Payer: Self-pay

## 2022-05-16 NOTE — Telephone Encounter (Signed)
Received a call from [employer], spoke with Mickel Baas she needed the cover physican's NPI to dispense pt's Entresto. Dr. Evette Georges NPI given for this pt. No further questions art this time.

## 2022-06-06 ENCOUNTER — Ambulatory Visit (HOSPITAL_COMMUNITY): Payer: Medicare Other | Attending: Adult Health

## 2022-06-06 DIAGNOSIS — I5041 Acute combined systolic (congestive) and diastolic (congestive) heart failure: Secondary | ICD-10-CM | POA: Diagnosis present

## 2022-06-06 DIAGNOSIS — G4733 Obstructive sleep apnea (adult) (pediatric): Secondary | ICD-10-CM | POA: Diagnosis not present

## 2022-06-07 LAB — ECHOCARDIOGRAM COMPLETE
Area-P 1/2: 2.29 cm2
MV M vel: 4.56 m/s
MV Peak grad: 83.2 mmHg
S' Lateral: 4.94 cm

## 2022-06-08 NOTE — Telephone Encounter (Signed)
Per message from Echo results patient referred to the Advanced Heart Failure team to help patient make more decisions and for recommendation  Patient has called today and feels he would like to wait on this until he speaks to the provider.  He ask if he is diligent with moderate exercise and he increases his Entresto then he may not need this referral just yet.  I discussed the concern for change since last test but he would prefer to talk to the provider personally before making this appointment for the heart failure team.  He is scheduled for 5/17.  Is it OK to wait until this appointment to make any decisions?  Please advise

## 2022-06-09 ENCOUNTER — Other Ambulatory Visit: Payer: Self-pay

## 2022-06-09 DIAGNOSIS — I5041 Acute combined systolic (congestive) and diastolic (congestive) heart failure: Secondary | ICD-10-CM

## 2022-06-12 ENCOUNTER — Telehealth: Payer: Self-pay

## 2022-06-12 DIAGNOSIS — I5041 Acute combined systolic (congestive) and diastolic (congestive) heart failure: Secondary | ICD-10-CM

## 2022-06-12 NOTE — Telephone Encounter (Addendum)
Called patient regarding results. Unable to leave message to advise patient referral for Advanced Heart Failure clinic will be needed.. Results seen by patient via My Chart. ----- Message from Jodelle Gross, NP sent at 06/08/2022  2:11 PM EDT ----- I have reviewed the echo report. The heart pumping function has worsened compared to 2022.  Please refer him to Advanced Heart Failure Team for first available appointment and recommendations.   KL

## 2022-07-14 ENCOUNTER — Ambulatory Visit: Payer: Medicare Other | Admitting: Cardiovascular Disease

## 2022-07-21 ENCOUNTER — Ambulatory Visit: Payer: Medicare Other | Attending: Cardiovascular Disease | Admitting: Physician Assistant

## 2022-07-21 ENCOUNTER — Encounter: Payer: Self-pay | Admitting: Physician Assistant

## 2022-07-21 VITALS — BP 102/52 | HR 69 | Ht 66.0 in | Wt 175.8 lb

## 2022-07-21 DIAGNOSIS — C7802 Secondary malignant neoplasm of left lung: Secondary | ICD-10-CM | POA: Diagnosis present

## 2022-07-21 DIAGNOSIS — I5022 Chronic systolic (congestive) heart failure: Secondary | ICD-10-CM

## 2022-07-21 NOTE — Progress Notes (Unsigned)
Cardiology Office Note:    Date:  07/22/2022   ID:  Christon, Tinder [birth year], MRN [id number]  PCP:  Physicians, Cheryln Manly [facility] Providers Cardiologist:  Nicki Guadalajara, MD     Referring MD: Physicians, Cheryln Manly F*   No chief complaint on file.   History of Present Illness:    Melvin Marshall is a 67 y.o. male with a hx of lung cancer, hypothyroidism, OSA on CPAP, metastatic lung cancer with mets, and history of chronic systolic heart failure.  Patient was initially seen in 2019 for heart failure symptom.  Echocardiogram obtained on 12/21/2017 demonstrated EF 20 to 25%, mild LVH, irregular apical contour with no definitive thrombus was Definity contrast, global hypokinesis, moderate MR, moderate TR, PA peak pressure 56 mmHg, small pericardial effusion.  He was placed on diuretic, carvedilol and Entresto.  EF remained low to less than 20% on repeat echocardiogram in November 2020.  He will previously referred to Dr. Ladona Ridgel for consideration of prophylactic ICD.  Unfortunately he never pursued EP evaluation due to financial issues.  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  He underwent chemotherapy and has been followed by oncology service at [facility].  His last visit with Dr. Tresa Endo was in January 2023 at which time he denied any significant chest pain or shortness of breath.  Patient was last seen by Bailey Mech NP on 04/14/2022.  He was started on Farxiga at the time.  Subsequent echocardiogram obtained on 06/06/2022 shows EF 20 to 25%, grade 1 DD, moderate LAE.  Patient presents today for follow-up.  He appears to be euvolemic on exam.  He denies any worsening shortness of breath, orthopnea or PND.  He never started on Farxiga as it is not covered by history of shortness.  He is feeling the best he has in the past several years.  For some  reason, he never had an ischemic evaluation, which I suspect is due to his unclear prognosis of cancer.  However patient has not never had any chest pain and is still functionally active.  He says prior to his his diagnosis of heart failure in 2019, he had a very bad viral infection.  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  He has been restarted on chemotherapy by his oncologist.  He can follow-up with Dr. Tresa Endo in 6 months.  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  Current Medications: Current Meds  Medication Sig   carvedilol (COREG) 6.25 MG tablet TAKE 1 TABLET BY MOUTH 2 (TWO) TIMES DAILY.   furosemide (LASIX) 20 MG tablet TAKE 1 TABLET BY MOUTH EVERY DAY   hydroxychloroquine (PLAQUENIL) 200 MG tablet Take by mouth.   levothyroxine (SYNTHROID) 100 MCG tablet Take 100 mcg by mouth daily.   sacubitril-valsartan (ENTRESTO) 49-51 MG Take 1 tablet by mouth 2 (two) times daily.   spironolactone (ALDACTONE) 25 MG tablet TAKE 0.5 TABLETS (12.5 MG TOTAL) BY MOUTH EVERY OTHER DAY.     Allergies:   Patient has no known allergies.  Social History   Socioeconomic History   Marital status: Married    Spouse name: Not on file   Number of children: 4   Years of education: Not on file   Highest education level: Bachelor's degree (e.g., BA, AB, BS)  Occupational History   Occupation: optometry    Comment: self employed  Tobacco Use   Smoking status: Never   Smokeless tobacco: Never  Substance and Sexual Activity   Alcohol use: Not Currently   Drug use: Never   Sexual activity: Not on file  Other Topics Concern   Not on file  Social History Narrative   Not on file   Social Determinants of Health   Financial Resource Strain: Not on file   Food Insecurity: Not on file  Transportation Needs: Not on file  Physical Activity: Not on file  Stress: Not on file  Social Connections: Not on file     Family History: The patient's family history includes COPD in his father; Diabetes type I in his child; Heart failure in his father; Liver disease in his mother; Suicidality in his paternal grandfather.  ROS:   Please see the history of present illness.     All other systems reviewed and are negative.  EKGs/Labs/Other Studies Reviewed:    The following studies were reviewed today:  Echo 06/06/2022 1. Left ventricular ejection fraction, by estimation, is 20 to 25%. The  left ventricle has severely decreased function. The left ventricle  demonstrates regional wall motion abnormalities (see scoring  diagram/findings for description). The left  ventricular internal cavity size was severely dilated. Left ventricular  diastolic parameters are consistent with Grade I diastolic dysfunction  (impaired relaxation).   2. Right ventricular systolic function is normal. The right ventricular  size is mildly enlarged. Tricuspid regurgitation signal is inadequate for  assessing PA pressure.   3. Left atrial size was moderately dilated.   4. Right atrial size was mildly dilated.   5. The mitral valve is normal in structure. Mild mitral valve  regurgitation. No evidence of mitral stenosis.   6. The aortic valve is tricuspid. Aortic valve regurgitation is not  visualized. No aortic stenosis is present.   7. Mildly dilated pulmonary artery.   8. The inferior vena cava is normal in size with greater than 50%  respiratory variability, suggesting right atrial pressure of 3 mmHg.   Comparison(s): Prior images reviewed side by side. Changes from prior  study are noted. LV now more dilated, EF slightly worse compared to prior.    EKG:  EKG is not ordered today.    Recent Labs: No results found for requested labs within last 365 days.  Recent  Lipid Panel    Component Value Date/Time   CHOL 169 12/17/2017 1026   TRIG 109 12/17/2017 1026   HDL 31 (L) 12/17/2017 1026   CHOLHDL 5.5 (H) 12/17/2017 1026   LDLCALC 116 (H) 12/17/2017 1026     Risk Assessment/Calculations:           Physical Exam:    VS:  BP (!) 102/52 (BP Location: Left Arm, Patient Position: Sitting, Cuff Size: Normal)   Pulse 69   Ht 5\' 6"  (1.676 m)   Wt 175 lb 12.8 oz (79.7 kg)   SpO2 97%   BMI 28.37 kg/m         Wt Readings from Last 3 Encounters:  07/21/22 175 lb 12.8 oz (79.7 kg)  04/14/22 180 lb 6.4 oz (81.8 kg)  03/03/21 201 lb 12.8  oz (91.5 kg)     GEN:  Well nourished, well developed in no acute distress HEENT: Normal NECK: No JVD; No carotid bruits LYMPHATICS: No lymphadenopathy CARDIAC: RRR, no murmurs, rubs, gallops RESPIRATORY:  Clear to auscultation without rales, wheezing or rhonchi  ABDOMEN: Soft, non-tender, non-distended MUSCULOSKELETAL:  No edema; No deformity  SKIN: Warm and dry NEUROLOGIC:  Alert and oriented x 3 PSYCHIATRIC:  Normal affect   ASSESSMENT:    1. Chronic systolic heart failure (HCC)   2. Malignant neoplasm metastatic to left lung [facility])    PLAN:    In order of problems listed above:  Chronic systolic heart failure: EF has been low since 2019.  Prior to the diagnosis of heart failure, he had a viral infection.  It does not appear ischemic workup was performed as patient had no chest pain.  He was placed on GDMT.  Repeat echocardiogram continue to show low EF.  At one point, he was referred to Dr. Ladona Ridgel for consideration of ICD.  Unfortunately, he never pursued EP evaluation due to financial issues.  Recent echocardiogram continues to show low EF of 20 to 25%.  He says he is feeling the best he has felt in a few years.  Given metastatic cancer and the lack of chest discomfort, I did not order a ischemic workup.  He appears to be euvolemic on exam.  Malignant neoplasm metastatic to the lung: Followed by  oncology service.           Medication Adjustments/Labs and Tests Ordered: Current medicines are reviewed at length with the patient today.  Concerns regarding medicines are outlined above.  No orders of the defined types were placed in this encounter.  No orders of the defined types were placed in this encounter.   Patient Instructions  Medication Instructions:  Your physician recommends that you continue on your current medications as directed. Please refer to the Current Medication list given to you today.  *If you need a refill on your cardiac medications before your next appointment, please call your pharmacy*   Lab Work: None   Testing/Procedures: None   Follow-Up: At [facility], you and your health needs are our priority.  As part of our continuing mission to provide you with exceptional heart care, we have created designated Provider Care Teams.  These Care Teams include your primary Cardiologist (physician) and Advanced Practice Providers (APPs -  Physician Assistants and Nurse Practitioners) who all work together to provide you with the care you need, when you need it.  Your next appointment:   6 month(s)  Provider:   Pamella Pert, MD     Signed, Azalee Course, [place]  07/22/2022 8:27 PM    [facility]

## 2022-07-21 NOTE — Patient Instructions (Signed)
Medication Instructions:  Your physician recommends that you continue on your current medications as directed. Please refer to the Current Medication list given to you today.  *If you need a refill on your cardiac medications before your next appointment, please call your pharmacy*   Lab Work: None   Testing/Procedures: None   Follow-Up: At [facility], you and your health needs are our priority.  As part of our continuing mission to provide you with exceptional heart care, we have created designated Provider Care Teams.  These Care Teams include your primary Cardiologist (physician) and Advanced Practice Providers (APPs -  Physician Assistants and Nurse Practitioners) who all work together to provide you with the care you need, when you need it.  Your next appointment:   6 month(s)  Provider:   Pamella Pert, MD

## 2022-09-14 ENCOUNTER — Telehealth (HOSPITAL_COMMUNITY): Payer: Self-pay | Admitting: Vascular Surgery

## 2022-09-14 NOTE — Telephone Encounter (Signed)
Lvm to make new chf appt ?

## 2022-10-14 ENCOUNTER — Other Ambulatory Visit: Payer: Self-pay | Admitting: Cardiovascular Disease

## 2022-12-01 ENCOUNTER — Telehealth (HOSPITAL_COMMUNITY): Payer: Self-pay | Admitting: Surgery

## 2022-12-01 NOTE — Telephone Encounter (Signed)
I attempted to reach patient to schedule [facility] appt after reviewing referral. I left a message for a return call.

## 2023-01-29 ENCOUNTER — Ambulatory Visit: Payer: Medicare Other | Attending: Cardiovascular Disease | Admitting: Cardiovascular Disease

## 2023-01-29 ENCOUNTER — Ambulatory Visit: Payer: Medicare Other | Admitting: Cardiovascular Disease

## 2023-01-30 ENCOUNTER — Encounter: Payer: Self-pay | Admitting: Cardiovascular Disease

## 2023-03-03 ENCOUNTER — Other Ambulatory Visit: Payer: Self-pay | Admitting: Adult Health

## 2023-03-13 ENCOUNTER — Other Ambulatory Visit: Payer: Self-pay | Admitting: Adult Health

## 2023-06-07 ENCOUNTER — Other Ambulatory Visit: Payer: Self-pay | Admitting: Physician Assistant

## 2023-06-07 MED ORDER — SPIRONOLACTONE 25 MG PO TABS: 12.5000 mg | ORAL_TABLET | ORAL | 1 refills | Status: DC

## 2023-06-07 NOTE — Addendum Note (Signed)
 Addended by: Margaret Pyle D on: 06/07/2023 03:03 PM   Modules accepted: Orders

## 2023-06-30 ENCOUNTER — Other Ambulatory Visit: Payer: Self-pay | Admitting: Cardiovascular Disease
